# Patient Record
Sex: Male | Born: 1970 | Race: White | Hispanic: No | Marital: Married | Smoking: Never smoker
Health system: Southern US, Community
[De-identification: ages and names within clinical notes are randomized; demographics above are authoritative.]

## PROBLEM LIST (undated history)

## (undated) DIAGNOSIS — E785 Hyperlipidemia, unspecified: Secondary | ICD-10-CM

## (undated) DIAGNOSIS — K219 Gastro-esophageal reflux disease without esophagitis: Secondary | ICD-10-CM

## (undated) DIAGNOSIS — I10 Essential (primary) hypertension: Secondary | ICD-10-CM

## (undated) HISTORY — DX: Gastro-esophageal reflux disease without esophagitis: K21.9

## (undated) HISTORY — DX: Essential (primary) hypertension: I10

## (undated) HISTORY — DX: Hyperlipidemia, unspecified: E78.5

---

## 1997-03-31 HISTORY — PX: TONSILLECTOMY: SUR1361

## 2015-08-22 ENCOUNTER — Other Ambulatory Visit: Payer: Self-pay | Admitting: *Deleted

## 2015-08-22 ENCOUNTER — Encounter: Payer: Self-pay | Admitting: *Deleted

## 2015-08-23 ENCOUNTER — Encounter: Payer: Self-pay | Admitting: Cardiology

## 2015-08-23 ENCOUNTER — Ambulatory Visit (INDEPENDENT_AMBULATORY_CARE_PROVIDER_SITE_OTHER): Payer: 59 | Admitting: Cardiology

## 2015-08-23 VITALS — BP 126/79 | HR 67 | Ht 68.0 in | Wt 238.0 lb

## 2015-08-23 DIAGNOSIS — R0789 Other chest pain: Secondary | ICD-10-CM

## 2015-08-23 NOTE — Patient Instructions (Signed)
Continue all current medications. Follow up in  3 months 

## 2015-08-23 NOTE — Progress Notes (Signed)
Patient ID: Kenneth Rowe, male   DOB: 05/30/1970, 45 y.o.   MRN: [id number]     Clinical Summary [name] is a 45 y.o.male seen today as a new patient  1. Chest pain - 3-4 weeks ago while in [facility] woke from sleep with pain. Left sided pain that was sharp, 5/10.  Radiated down into both arms, hands felt numb and tingling. No other associated symptoms. Not positional. Pain lasted about 30 minutes, felt tired the rest of the day. [facility]. F/u with primary, referred for stress test.  - May 2017 stress echo at [facility] negative for ischemia - episode of chest pain on Tuesday and Wednesday of last week. Sharp pain for just a few seconds - has had some DOE with activities he attributes to weight, deconditioning.    CAD risk factors: Father MI age 44, paternal grandfather MI lates 44s, first cousin MI early 44s.HTN, hyperlipidemia.     SH: works Geneticist, molecular, travels extensively.  Past Medical History  Diagnosis Date  . Hypertension   . Hyperlipidemia   . GERD (gastroesophageal reflux disease)      Allergies  Allergen Reactions  . Lisinopril     Cough     Current Outpatient Prescriptions  Medication Sig Dispense Refill  . amLODipine (NORVASC) 10 MG tablet Take 10 mg by mouth daily.    Marland Kitchen aspirin EC 81 MG tablet Take 81 mg by mouth daily.    Marland Kitchen atorvastatin (LIPITOR) 40 MG tablet Take 40 mg by mouth daily.    . Fish Oil-Cholecalciferol (FISH OIL + D3) 1000-1000 MG-UNIT CAPS Take 1 capsule by mouth daily.    Marland Kitchen losartan (COZAAR) 100 MG tablet Take 100 mg by mouth daily.    Marland Kitchen omeprazole (PRILOSEC) 40 MG capsule Take 40 mg by mouth daily as needed.     No current facility-administered medications for this visit.     No past surgical history on file.   Allergies  Allergen Reactions  . Lisinopril     Cough      Family History  Problem Relation Age of Onset  . Hypertension Mother   . Hypertension Father   .  Heart attack Father   . Diabetes Maternal Grandmother   . Heart attack Paternal Grandfather      Social History [name] reports that he has never smoked. He has never used smokeless tobacco. [name] has no alcohol history on file.   Review of Systems CONSTITUTIONAL: No weight loss, fever, chills, weakness or fatigue.  HEENT: Eyes: No visual loss, blurred vision, double vision or yellow sclerae.No hearing loss, sneezing, congestion, runny nose or sore throat.  SKIN: No rash or itching.  CARDIOVASCULAR: per HPI RESPIRATORY: No shortness of breath, cough or sputum.  GASTROINTESTINAL: No anorexia, nausea, vomiting or diarrhea. No abdominal pain or blood.  GENITOURINARY: No burning on urination, no polyuria NEUROLOGICAL: No headache, dizziness, syncope, paralysis, ataxia, numbness or tingling in the extremities. No change in bowel or bladder control.  MUSCULOSKELETAL: No muscle, back pain, joint pain or stiffness.  LYMPHATICS: No enlarged nodes. No history of splenectomy.  PSYCHIATRIC: No history of depression or anxiety.  ENDOCRINOLOGIC: No reports of sweating, cold or heat intolerance. No polyuria or polydipsia.  Marland Kitchen   Physical Examination Filed Vitals:   08/23/15 0854  BP: 126/79  Pulse: 67   Filed Vitals:   08/23/15 0854  Height: 5' 8"  (1.727 m)  Weight: 238 lb (107.956 kg)  Gen: resting comfortably, no acute distress HEENT: no scleral icterus, pupils equal round and reactive, no palptable cervical adenopathy,  CV: RRR, no m/r/g, no jvd Resp: Clear to auscultation bilaterally GI: abdomen is soft, non-tender, non-distended, normal bowel sounds, no hepatosplenomegaly MSK: extremities are warm, no edema.  Skin: warm, no rash Neuro:  no focal deficits Psych: appropriate affect      Assessment and Plan  1. Chest pain - atypical chest pain. Negative stress echo. Do not recommend further cardiac testing at this time. Continue CAD risk factor modification and  continue to monitor symptoms.   F/u 3 months      Kenneth Rowe, M.D.

## 2015-11-21 ENCOUNTER — Encounter: Payer: Self-pay | Admitting: *Deleted

## 2015-11-22 ENCOUNTER — Encounter: Payer: Self-pay | Admitting: Cardiology

## 2015-11-22 ENCOUNTER — Ambulatory Visit: Payer: 59 | Admitting: Cardiology

## 2015-11-22 NOTE — Progress Notes (Deleted)
Clinical Summary Kenneth Rowe is a 45 y.o.male seen today for follow up of the following medical problems.   . Chest pain - 3-4 weeks ago while in [place] woke from sleep with pain. Left sided pain that was sharp, 5/10.  Radiated down into both arms, hands felt numb and tingling. No other associated symptoms. Not positional. Pain lasted about 30 minutes, felt tired the rest of the day. Seen in [facility]. F/u with primary, referred for stress test.  - May 2017 stress echo at [facility] negative for ischemia - episode of chest pain on Tuesday and Wednesday of last week. Sharp pain for just a few seconds - has had some DOE with activities he attributes to weight, deconditioning.    CAD risk factors: Father MI age 73, paternal grandfather MI lates 69s, first cousin MI early 58s.HTN, hyperlipidemia.     SH: works Geneticist, molecular, travels extensively.  Past Medical History:  Diagnosis Date  . GERD (gastroesophageal reflux disease)   . Hyperlipidemia   . Hypertension      Allergies  Allergen Reactions  . Lisinopril     Cough     Current Outpatient Prescriptions  Medication Sig Dispense Refill  . amLODipine (NORVASC) 10 MG tablet Take 10 mg by mouth daily.    Marland Kitchen aspirin EC 81 MG tablet Take 81 mg by mouth daily.    Marland Kitchen atorvastatin (LIPITOR) 40 MG tablet Take 40 mg by mouth daily.    . Fish Oil-Cholecalciferol (FISH OIL + D3) 1000-1000 MG-UNIT CAPS Take 1 capsule by mouth daily.    Marland Kitchen losartan (COZAAR) 100 MG tablet Take 100 mg by mouth daily.    Marland Kitchen omeprazole (PRILOSEC) 40 MG capsule Take 40 mg by mouth daily as needed.     No current facility-administered medications for this visit.      Past Surgical History:  Procedure Laterality Date  . TONSILLECTOMY  1999     Allergies  Allergen Reactions  . Lisinopril     Cough      Family History  Problem Relation Age of Onset  . Hypertension Mother   . Hypertension Father   .  Heart attack Father   . Diabetes Maternal Grandmother   . Heart attack Paternal Grandfather      Social History [facility]e reports that he has never smoked. He has never used smokeless tobacco. Kenneth Rowe has no alcohol history on file.   Review of Systems CONSTITUTIONAL: No weight loss, fever, chills, weakness or fatigue.  HEENT: Eyes: No visual loss, blurred vision, double vision or yellow sclerae.No hearing loss, sneezing, congestion, runny nose or sore throat.  SKIN: No rash or itching.  CARDIOVASCULAR:  RESPIRATORY: No shortness of breath, cough or sputum.  GASTROINTESTINAL: No anorexia, nausea, vomiting or diarrhea. No abdominal pain or blood.  GENITOURINARY: No burning on urination, no polyuria NEUROLOGICAL: No headache, dizziness, syncope, paralysis, ataxia, numbness or tingling in the extremities. No change in bowel or bladder control.  MUSCULOSKELETAL: No muscle, back pain, joint pain or stiffness.  LYMPHATICS: No enlarged nodes. No history of splenectomy.  PSYCHIATRIC: No history of depression or anxiety.  ENDOCRINOLOGIC: No reports of sweating, cold or heat intolerance. No polyuria or polydipsia.  Marland Kitchen   Physical Examination There were no vitals filed for this visit. There were no vitals filed for this visit.  Gen: resting comfortably, no acute distress HEENT: no scleral icterus, pupils equal round and reactive, no palptable cervical adenopathy,  CV  Resp: Clear to auscultation bilaterally GI: abdomen is soft, non-tender, non-distended, normal bowel sounds, no hepatosplenomegaly MSK: extremities are warm, no edema.  Skin: warm, no rash Neuro:  no focal deficits Psych: appropriate affect   Diagnostic Studies     Assessment and Plan   1. Chest pain - atypical chest pain. Negative stress echo. Do not recommend further cardiac testing at this time. Continue CAD risk factor modification and continue to monitor symptoms.   F/u 3 months     Arnoldo Lenis, M.D., F.A.C.C.

## 2016-04-01 DIAGNOSIS — J329 Chronic sinusitis, unspecified: Secondary | ICD-10-CM | POA: Diagnosis not present

## 2016-04-01 DIAGNOSIS — E785 Hyperlipidemia, unspecified: Secondary | ICD-10-CM | POA: Diagnosis not present

## 2016-04-01 DIAGNOSIS — I10 Essential (primary) hypertension: Secondary | ICD-10-CM | POA: Diagnosis not present

## 2016-12-02 DIAGNOSIS — E785 Hyperlipidemia, unspecified: Secondary | ICD-10-CM | POA: Diagnosis not present

## 2016-12-02 DIAGNOSIS — I10 Essential (primary) hypertension: Secondary | ICD-10-CM | POA: Diagnosis not present

## 2017-06-01 DIAGNOSIS — E785 Hyperlipidemia, unspecified: Secondary | ICD-10-CM | POA: Diagnosis not present

## 2017-06-01 DIAGNOSIS — I10 Essential (primary) hypertension: Secondary | ICD-10-CM | POA: Diagnosis not present

## 2017-06-01 DIAGNOSIS — Z2821 Immunization not carried out because of patient refusal: Secondary | ICD-10-CM | POA: Diagnosis not present

## 2017-07-03 DIAGNOSIS — E782 Mixed hyperlipidemia: Secondary | ICD-10-CM | POA: Diagnosis not present

## 2017-07-03 DIAGNOSIS — I10 Essential (primary) hypertension: Secondary | ICD-10-CM | POA: Diagnosis not present

## 2017-10-20 ENCOUNTER — Encounter: Payer: Self-pay | Admitting: Orthopaedic Surgery

## 2017-10-20 ENCOUNTER — Ambulatory Visit: Payer: 59 | Admitting: Orthopaedic Surgery

## 2017-10-20 ENCOUNTER — Ambulatory Visit (INDEPENDENT_AMBULATORY_CARE_PROVIDER_SITE_OTHER): Payer: 59

## 2017-10-20 DIAGNOSIS — M542 Cervicalgia: Secondary | ICD-10-CM | POA: Diagnosis not present

## 2017-10-20 DIAGNOSIS — M25511 Pain in right shoulder: Secondary | ICD-10-CM

## 2017-10-20 DIAGNOSIS — M25521 Pain in right elbow: Secondary | ICD-10-CM

## 2017-10-20 MED ORDER — NAPROXEN 500 MG PO TABS: 500.0000 mg | ORAL_TABLET | Freq: Two times a day (BID) | ORAL | 5 refills | Status: DC

## 2017-10-21 NOTE — Progress Notes (Signed)
Subjective:    Patient ID: [facility], male    DOB: 1971-01-19, 47 y.o.   MRN: [id number]  HPI He has had pain of the right elbow since February of this year.  He was lifting a dressing on 05-20-17 and since then has had pain in the right anterior shoulder and elbow area.  If he keeps his elbow flexed for a while like using a telephone and then extending his elbow he has pain in the insertion of the biceps area.  He has no redness.  He has had tingling of the long finger at times and it bothers him.  He has pain from the shoulder to the long finger at times.  This happens even without elbow motion.  He has noticed the tingling more and more recently.  He was seen at [facility] and was given a prednisone dose pack.  It really helped but a week after he had finished it the pain returned and so did the tingling.  He has also noticed slight right sided neck pain.    He has not been on any NSAID.  He has tried ice, heat and rubs with no help.  He has no swelling.    Review of Systems  Constitutional: Positive for activity change.  Musculoskeletal: Positive for arthralgias and neck pain.   For Review of Systems, all other systems reviewed and are negative.  Past Medical History:  Diagnosis Date  . GERD (gastroesophageal reflux disease)   . Hyperlipidemia   . Hypertension     Past Surgical History:  Procedure Laterality Date  . TONSILLECTOMY  1999    Current Outpatient Medications on File Prior to Visit  Medication Sig Dispense Refill  . amLODipine (NORVASC) 10 MG tablet Take 10 mg by mouth daily.    Marland Kitchen aspirin EC 81 MG tablet TAKE 1 TABLET ONCE DAILY.    Marland Kitchen atorvastatin (LIPITOR) 40 MG tablet Take 40 mg by mouth daily.    . DOCOSAHEXAENOIC ACID PO Take 1 g by mouth.    . Fish Oil-Cholecalciferol (FISH OIL + D3) 1000-1000 MG-UNIT CAPS Take 1 capsule by mouth daily.    Marland Kitchen losartan (COZAAR) 100 MG tablet Take 100 mg by mouth daily.    Marland Kitchen omeprazole (PRILOSEC) 40 MG capsule Take  40 mg by mouth daily as needed.     No current facility-administered medications on file prior to visit.     Social History   Socioeconomic History  . Marital status: Married    Spouse name: Not on file  . Number of children: Not on file  . Years of education: Not on file  . Highest education level: Not on file  Occupational History    Comment: Regional service manager  Social Needs  . Financial resource strain: Not on file  . Food insecurity:    Worry: Not on file    Inability: Not on file  . Transportation needs:    Medical: Not on file    Non-medical: Not on file  Tobacco Use  . Smoking status: Never Smoker  . Smokeless tobacco: Never Used  Substance and Sexual Activity  . Alcohol use: Not on file  . Drug use: Not on file  . Sexual activity: Not on file  Lifestyle  . Physical activity:    Days per week: Not on file    Minutes per session: Not on file  . Stress: Not on file  Relationships  . Social connections:    Talks on phone: Not  on file    Gets together: Not on file    Attends religious service: Not on file    Active member of club or organization: Not on file    Attends meetings of clubs or organizations: Not on file    Relationship status: Not on file  . Intimate partner violence:    Fear of current or ex partner: Not on file    Emotionally abused: Not on file    Physically abused: Not on file    Forced sexual activity: Not on file  Other Topics Concern  . Not on file  Social History Narrative  . Not on file    Family History  Problem Relation Age of Onset  . Hypertension Mother   . Hypertension Father   . Heart attack Father   . Diabetes Maternal Grandmother   . Heart attack Paternal Grandfather     There were no vitals taken for this visit.  5'8", 243 pounds, afebrile, BP 132/94, pulse 70.    Objective:   Physical Exam  Constitutional: He is oriented to person, place, and time. He appears well-developed and well-nourished.  HENT:    Head: Normocephalic and atraumatic.  Eyes: Pupils are equal, round, and reactive to light. Conjunctivae and EOM are normal.  Neck: Normal range of motion. Neck supple.  Cardiovascular: Normal rate, regular rhythm and intact distal pulses.  Pulmonary/Chest: Effort normal.  Abdominal: Soft.  Musculoskeletal:       Right shoulder: He exhibits tenderness.       Arms: Neurological: He is alert and oriented to person, place, and time. He has normal reflexes. He displays normal reflexes. No cranial nerve deficit. He exhibits normal muscle tone. Coordination normal.  Skin: Skin is warm and dry.  Psychiatric: He has a normal mood and affect. His behavior is normal. Judgment and thought content normal.     X-rays were done of the cervical spine, reported separately.  He has loss of lordosis.    I have reviewed the notes from [facility].  Assessment & Plan:   Encounter Diagnoses  Name Primary?  . Neck pain Yes  . Pain in joint of right shoulder   . Right elbow pain    I am concerned about a possible HNP of the cervical spine.  He has not been on any NSAIDs.  I know he has GERD symptoms and have given precautions but I will begin Naprosyn 500 po bid pc.  He may need MRI of the cervical spine.  Loss of lordosis indicates more of a problem from the neck area.  Return in two weeks.  Call if any problem.  Precautions discussed.   Electronically Signed Sanjuana Kava, MD 7/24/20197:40 AM

## 2017-11-03 ENCOUNTER — Encounter: Payer: Self-pay | Admitting: Orthopaedic Surgery

## 2017-11-03 ENCOUNTER — Ambulatory Visit (INDEPENDENT_AMBULATORY_CARE_PROVIDER_SITE_OTHER): Payer: 59 | Admitting: Orthopaedic Surgery

## 2017-11-03 VITALS — BP 124/82 | HR 74 | Temp 98.1°F | Ht 68.0 in | Wt 243.0 lb

## 2017-11-03 DIAGNOSIS — M542 Cervicalgia: Secondary | ICD-10-CM | POA: Diagnosis not present

## 2017-11-03 NOTE — Progress Notes (Signed)
Patient VO:HYWVPXT [facility], male DOB:1970/06/10, 47 y.o. GGY:[id number]  Chief Complaint  Patient presents with  . Elbow Pain  . Neck Pain    HPI  Kenn[facility] Rowe is a 47 y.o. male who has right arm paresthesias.  He is better with the Naprosyn but still has tingling in the fingers from the shoulder.  He has less pain of the mid upper arm.  His tingling is more of the little and ring fingers but sometimes of the thumb.  He has no new trauma, no redness.  I would like to get a MRI of the cervical spine as I am concerned about a HNP.   Body mass index is 36.95 kg/m.  ROS  Review of Systems  Constitutional: Positive for activity change.  Musculoskeletal: Positive for arthralgias and neck pain.  All other systems reviewed and are negative.   All other systems reviewed and are negative.  Past Medical History:  Diagnosis Date  . GERD (gastroesophageal reflux disease)   . Hyperlipidemia   . Hypertension     Past Surgical History:  Procedure Laterality Date  . TONSILLECTOMY  1999    Family History  Problem Relation Age of Onset  . Hypertension Mother   . Hypertension Father   . Heart attack Father   . Diabetes Maternal Grandmother   . Heart attack Paternal Grandfather     Social History Social History   Tobacco Use  . Smoking status: Never Smoker  . Smokeless tobacco: Never Used  Substance Use Topics  . Alcohol use: Not on file  . Drug use: Not on file    Allergies  Allergen Reactions  . Lisinopril     Cough    Current Outpatient Medications  Medication Sig Dispense Refill  . amLODipine (NORVASC) 10 MG tablet Take 10 mg by mouth daily.    Marland Kitchen aspirin EC 81 MG tablet TAKE 1 TABLET ONCE DAILY.    Marland Kitchen atorvastatin (LIPITOR) 40 MG tablet Take 40 mg by mouth daily.    . DOCOSAHEXAENOIC ACID PO Take 1 g by mouth.    . Fish Oil-Cholecalciferol (FISH OIL + D3) 1000-1000 MG-UNIT CAPS Take 1 capsule by mouth daily.    Marland Kitchen losartan (COZAAR) 100 MG tablet Take 100  mg by mouth daily.    . naproxen (NAPROSYN) 500 MG tablet Take 1 tablet (500 mg total) by mouth 2 (two) times daily with a meal. 60 tablet 5  . omeprazole (PRILOSEC) 40 MG capsule Take 40 mg by mouth daily as needed.     No current facility-administered medications for this visit.      Physical Exam  Blood pressure 124/82, pulse 74, temperature 98.1 F (36.7 C), height 5' 8"  (1.727 m), weight 243 lb (110.2 kg).  Constitutional: overall normal hygiene, normal nutrition, well developed, normal grooming, normal body habitus. Assistive device:none  Musculoskeletal: gait and station Limp none, muscle tone and strength are normal, no tremors or atrophy is present.  .  Neurological: coordination overall normal.  Deep tendon reflex/nerve stretch intact.  Sensation normal.  Cranial nerves II-XII intact.   Skin:   Normal overall no scars, lesions, ulcers or rashes. No psoriasis.  Psychiatric: Alert and oriented x 3.  Recent memory intact, remote memory unclear.  Normal mood and affect. Well groomed.  Good eye contact.  Cardiovascular: overall no swelling, no varicosities, no edema bilaterally, normal temperatures of the legs and arms, no clubbing, cyanosis and good capillary refill.  Lymphatic: palpation is normal.  Right shoulder has  full motion.  Neck has full motion.  Grips are good.  NV intact.  All other systems reviewed and are negative   The patient has been educated about the nature of the problem(s) and counseled on treatment options.  The patient appeared to understand what I have discussed and is in agreement with it.  Encounter Diagnosis  Name Primary?  . Neck pain Yes    PLAN Call if any problems.  Precautions discussed.  Continue current medications.   Return to clinic after MRI of the cervical spine.   Electronically Signed Sanjuana Kava, MD 8/6/20192:48 PM

## 2017-11-11 ENCOUNTER — Ambulatory Visit (HOSPITAL_COMMUNITY)
Admission: RE | Admit: 2017-11-11 | Discharge: 2017-11-11 | Disposition: A | Discharge status: Home / Self Care | Payer: 59 | Source: Ambulatory Visit | Attending: Orthopaedic Surgery | Admitting: Orthopaedic Surgery

## 2017-11-11 DIAGNOSIS — R202 Paresthesia of skin: Secondary | ICD-10-CM | POA: Diagnosis not present

## 2017-11-11 DIAGNOSIS — M542 Cervicalgia: Secondary | ICD-10-CM | POA: Insufficient documentation

## 2017-11-12 ENCOUNTER — Encounter: Payer: Self-pay | Admitting: Orthopaedic Surgery

## 2017-11-12 ENCOUNTER — Ambulatory Visit (INDEPENDENT_AMBULATORY_CARE_PROVIDER_SITE_OTHER): Payer: 59 | Admitting: Orthopaedic Surgery

## 2017-11-12 VITALS — BP 121/81 | HR 69 | Temp 98.3°F | Ht 69.0 in | Wt 238.0 lb

## 2017-11-12 DIAGNOSIS — M25521 Pain in right elbow: Secondary | ICD-10-CM | POA: Diagnosis not present

## 2017-11-12 DIAGNOSIS — M25511 Pain in right shoulder: Secondary | ICD-10-CM

## 2017-11-12 NOTE — Progress Notes (Signed)
Patient [facility], male DOB:Nov 15, 1970, 47 y.o. BOF:[id number]  Chief Complaint  Patient presents with  . Torticollis    Recheck on neck pain, review MRI results.    HPI  [facility]e is a 47 y.o. male who has right arm pain, paresthesias to the little finger on the right and neck pain.  The neck pain has resolved.  He had a MRI of the cervical spine showing: IMPRESSION: Normal MRI appearance of the cervical spine - with no disc herniation, spinal stenosis, or neural impingement - aside from nonspecific loss of cervical lordosis  I have explained the findings to him.  I had thought he had a HNP of the cervical spine as he had loss of the cervical lordosis, the paresthesias to the little finger on the right, and neck pain.    I will have a hand surgeon see him in regards to the upper arm pain he has.  It is positional.  It still bothers him.    His paresthesias to the little finger is still present but less.    Body mass index is 35.15 kg/m.  ROS  Review of Systems  Constitutional: Positive for activity change.  Musculoskeletal: Positive for arthralgias and neck pain.  All other systems reviewed and are negative.   All other systems reviewed and are negative.  Past Medical History:  Diagnosis Date  . GERD (gastroesophageal reflux disease)   . Hyperlipidemia   . Hypertension     Past Surgical History:  Procedure Laterality Date  . TONSILLECTOMY  1999    Family History  Problem Relation Age of Onset  . Hypertension Mother   . Hypertension Father   . Heart attack Father   . Diabetes Maternal Grandmother   . Heart attack Paternal Grandfather     Social History Social History   Tobacco Use  . Smoking status: Never Smoker  . Smokeless tobacco: Never Used  Substance Use Topics  . Alcohol use: Not on file  . Drug use: Not on file    Allergies  Allergen Reactions  . Lisinopril     Cough    Current Outpatient Medications  Medication  Sig Dispense Refill  . amLODipine (NORVASC) 10 MG tablet Take 10 mg by mouth daily.    Marland Kitchen aspirin EC 81 MG tablet TAKE 1 TABLET ONCE DAILY.    Marland Kitchen atorvastatin (LIPITOR) 40 MG tablet Take 40 mg by mouth daily.    . DOCOSAHEXAENOIC ACID PO Take 1 g by mouth.    . Fish Oil-Cholecalciferol (FISH OIL + D3) 1000-1000 MG-UNIT CAPS Take 1 capsule by mouth daily.    Marland Kitchen losartan (COZAAR) 100 MG tablet Take 100 mg by mouth daily.    . naproxen (NAPROSYN) 500 MG tablet Take 1 tablet (500 mg total) by mouth 2 (two) times daily with a meal. 60 tablet 5  . omeprazole (PRILOSEC) 40 MG capsule Take 40 mg by mouth daily as needed.     No current facility-administered medications for this visit.      Physical Exam  Blood pressure 121/81, pulse 69, temperature 98.3 F (36.8 C), height 5' 9"  (1.753 m), weight 238 lb (108 kg).  Constitutional: overall normal hygiene, normal nutrition, well developed, normal grooming, normal body habitus. Assistive device:none  Musculoskeletal: gait and station Limp none, muscle tone and strength are normal, no tremors or atrophy is present.  .  Neurological: coordination overall normal.  Deep tendon reflex/nerve stretch intact.  Sensation normal.  Cranial nerves II-XII intact.  Skin:   Normal overall no scars, lesions, ulcers or rashes. No psoriasis.  Psychiatric: Alert and oriented x 3.  Recent memory intact, remote memory unclear.  Normal mood and affect. Well groomed.  Good eye contact.  Cardiovascular: overall no swelling, no varicosities, no edema bilaterally, normal temperatures of the legs and arms, no clubbing, cyanosis and good capillary refill.  Lymphatic: palpation is normal.  ROM of the right elbow is full.  He has no defect of the biceps area or any area in the elbow or upper arm area where he has pain.  He has no swelling.  NV intact.  Strength is normal.    All other systems reviewed and are negative   The patient has been educated about the nature of  the problem(s) and counseled on treatment options.  The patient appeared to understand what I have discussed and is in agreement with it.  No diagnosis found.  PLAN Call if any problems.  Precautions discussed.  Continue current medications.   Return to clinic to see hand surgeon   Electronically Signed Sanjuana Kava, MD 8/15/20192:21 PM

## 2017-11-12 NOTE — Patient Instructions (Signed)
[facility] [address] [place],  [place]  [address]  Get Driving Directions  Main: [phone]  Fax: [phone]

## 2017-12-01 ENCOUNTER — Ambulatory Visit (INDEPENDENT_AMBULATORY_CARE_PROVIDER_SITE_OTHER): Payer: 59 | Admitting: Orthopedic Surgery

## 2017-12-22 ENCOUNTER — Encounter (INDEPENDENT_AMBULATORY_CARE_PROVIDER_SITE_OTHER): Payer: Self-pay | Admitting: Orthopedic Surgery

## 2017-12-22 ENCOUNTER — Ambulatory Visit (INDEPENDENT_AMBULATORY_CARE_PROVIDER_SITE_OTHER): Payer: Self-pay

## 2017-12-22 ENCOUNTER — Ambulatory Visit (INDEPENDENT_AMBULATORY_CARE_PROVIDER_SITE_OTHER): Payer: 59 | Admitting: Physician Assistant

## 2017-12-22 VITALS — Ht 69.0 in | Wt 238.0 lb

## 2017-12-22 DIAGNOSIS — M7541 Impingement syndrome of right shoulder: Secondary | ICD-10-CM

## 2017-12-22 DIAGNOSIS — M79601 Pain in right arm: Secondary | ICD-10-CM

## 2017-12-23 ENCOUNTER — Encounter (INDEPENDENT_AMBULATORY_CARE_PROVIDER_SITE_OTHER): Payer: Self-pay | Admitting: Physician Assistant

## 2017-12-23 DIAGNOSIS — M79601 Pain in right arm: Secondary | ICD-10-CM

## 2017-12-23 DIAGNOSIS — M7541 Impingement syndrome of right shoulder: Secondary | ICD-10-CM | POA: Diagnosis not present

## 2017-12-23 MED ORDER — METHYLPREDNISOLONE ACETATE 40 MG/ML IJ SUSP
40.0000 mg | INTRAMUSCULAR | Status: AC | PRN
  Administered 2017-12-23: 40 mg via INTRA_ARTICULAR

## 2017-12-23 MED ORDER — LIDOCAINE HCL 1 % IJ SOLN
5.0000 mL | INTRAMUSCULAR | Status: AC | PRN
  Administered 2017-12-23: 5 mL

## 2017-12-23 NOTE — Progress Notes (Signed)
Office Visit Note   Patient: Kenneth Rowe           Date of Birth: March 01, 1971           MRN: [id number] Visit Date: 12/22/2017              Requested by: Caryl Bis, MD [place], [place] [address] PCP: Caryl Bis, MD  Chief Complaint  Patient presents with  . Right Upper Arm - Pain      HPI: Patient is a 47 year old male who presents with 7 months history of right arm pain.  He reports that in February of this year he was lifting a dresser to move it and he developed pain in the right biceps and triceps area following this.  He states that it hurt off and on ever since then.  He has been taking some Naprosyn 500 mg with some relief but not consistent.  He says he feels like he tore something in his arm.  He did have an MRI of his cervical spine done about 4 weeks ago through his primary care physician and this was negative for acute injuries fractures and otherwise looked normal.  Radiographs today of the right arm are normal.  He reports some night pain.  He is tried heat and ice with little relief.  He reports that it is a nagging throbbing discomfort.  He reports pain when he tries to hold his arm up for instance like when he rolled his cell phone in his right hand up to his ear.  Assessment & Plan: Visit Diagnoses:  1. Impingement syndrome of right shoulder   2. Right upper limb pain     Plan: Right shoulder was injected with Depo-medrol /lidocaine under sterile techniques and the patient tolerated this well. He will follow up in 4 weeks or sooner should he have difficulties in the interim.   Follow-Up Instructions: Return in about 4 weeks (around 01/19/2018).   Ortho Exam  Patient is alert, oriented, no adenopathy, well-dressed, normal affect, normal respiratory effort. Examination of the right upper extremity shows the patient to be tender to palpation over the proximal biceps insertion and deltoid.  He has pain with abduction internal rotation of the  shoulder.  He is nontender to palpation over the [facility] joint.  Imaging: Xr Humerus Right  Result Date: 12/23/2017 Negative for fracture or other abnormality  No images are attached to the encounter.  Labs: No results found for: HGBA1C, ESRSEDRATE, CRP, LABURIC, REPTSTATUS, GRAMSTAIN, CULT, LABORGA   No results found for: ALBUMIN, PREALBUMIN, LABURIC  Body mass index is 35.15 kg/m.  Orders:  Orders Placed This Encounter  Procedures  . XR Humerus Right   No orders of the defined types were placed in this encounter.    Procedures: Large Joint Inj: R subacromial bursa on 12/23/2017 10:13 AM Indications: diagnostic evaluation and pain Details: 22 G 1.5 in needle, posterior approach  Arthrogram: No  Medications: 5 mL lidocaine 1 %; 40 mg methylPREDNISolone acetate 40 MG/ML Outcome: tolerated well, no immediate complications Procedure, treatment alternatives, risks and benefits explained, specific risks discussed. Consent was given by the patient. Immediately prior to procedure a time out was called to verify the correct patient, procedure, equipment, support staff and site/side marked as required. Patient was prepped and draped in the usual sterile fashion.      Clinical Data: No additional findings.  ROS:  All other systems negative, except as noted in the HPI. Review  of Systems  Objective: Vital Signs: Ht 5' 9"  (1.753 m)   Wt 238 lb (108 kg)   BMI 35.15 kg/m   Specialty Comments:  No specialty comments available.  PMFS History: There are no active problems to display for this patient.  Past Medical History:  Diagnosis Date  . GERD (gastroesophageal reflux disease)   . Hyperlipidemia   . Hypertension     Family History  Problem Relation Age of Onset  . Hypertension Mother   . Hypertension Father   . Heart attack Father   . Diabetes Maternal Grandmother   . Heart attack Paternal Grandfather     Past Surgical History:  Procedure Laterality Date  .  TONSILLECTOMY  1999   Social History   Occupational History    CommentGarment/textile technologist  Tobacco Use  . Smoking status: Never Smoker  . Smokeless tobacco: Never Used  Substance and Sexual Activity  . Alcohol use: Not on file  . Drug use: Not on file  . Sexual activity: Not on file

## 2017-12-31 DIAGNOSIS — E782 Mixed hyperlipidemia: Secondary | ICD-10-CM | POA: Diagnosis not present

## 2017-12-31 DIAGNOSIS — I10 Essential (primary) hypertension: Secondary | ICD-10-CM | POA: Diagnosis not present

## 2018-01-05 DIAGNOSIS — E782 Mixed hyperlipidemia: Secondary | ICD-10-CM | POA: Diagnosis not present

## 2018-01-05 DIAGNOSIS — R0789 Other chest pain: Secondary | ICD-10-CM | POA: Diagnosis not present

## 2018-01-05 DIAGNOSIS — I10 Essential (primary) hypertension: Secondary | ICD-10-CM | POA: Diagnosis not present

## 2018-01-11 DIAGNOSIS — R0789 Other chest pain: Secondary | ICD-10-CM | POA: Diagnosis not present

## 2018-01-11 DIAGNOSIS — E785 Hyperlipidemia, unspecified: Secondary | ICD-10-CM | POA: Diagnosis not present

## 2018-01-11 DIAGNOSIS — I10 Essential (primary) hypertension: Secondary | ICD-10-CM | POA: Diagnosis not present

## 2018-01-19 ENCOUNTER — Ambulatory Visit (INDEPENDENT_AMBULATORY_CARE_PROVIDER_SITE_OTHER): Payer: 59 | Admitting: Orthopedic Surgery

## 2018-01-21 ENCOUNTER — Encounter: Payer: Self-pay | Admitting: *Deleted

## 2018-01-21 ENCOUNTER — Telehealth: Payer: Self-pay

## 2018-01-21 ENCOUNTER — Telehealth: Payer: Self-pay | Admitting: Cardiology

## 2018-01-21 NOTE — Telephone Encounter (Signed)
SENT REFERRAL TO SCHEDULING AND FILED NOTES

## 2018-01-21 NOTE — Telephone Encounter (Signed)
Pt scheduled with L Kilroy 11/29 - will request stress test

## 2018-01-21 NOTE — Telephone Encounter (Signed)
Patient worried about Stress test results that Dr Pleas Koch spoke to him about.   Said that he had test peformed at [facility].  He is currently scheduled in [facility] Nov 29 with an extender but wants to be seen asap by Dr Harl Bowie if possible

## 2018-01-21 NOTE — Telephone Encounter (Signed)
Stress test received

## 2018-01-22 NOTE — Telephone Encounter (Signed)
Pt declined stress test says his wife called and set him up with provider in the [place] for Tuesday - pt and wife did not want to wait for an appt

## 2018-01-22 NOTE — Telephone Encounter (Signed)
Stress test reviewed, from report suggests abnormal EKG findings that may suggest a blockage. Needs a more detailed stress test to further evaluate, can we set him up for an exercise nuclear stress test next week for chest pain. Hold norvasc day of test.   Zandra Abts MD

## 2018-01-26 ENCOUNTER — Ambulatory Visit (INDEPENDENT_AMBULATORY_CARE_PROVIDER_SITE_OTHER): Payer: 59 | Admitting: Physician Assistant

## 2018-01-26 ENCOUNTER — Encounter: Payer: Self-pay | Admitting: Physician Assistant

## 2018-01-26 VITALS — BP 140/101 | HR 67 | Ht 69.0 in | Wt 243.4 lb

## 2018-01-26 DIAGNOSIS — R079 Chest pain, unspecified: Secondary | ICD-10-CM | POA: Diagnosis not present

## 2018-01-26 DIAGNOSIS — Z01812 Encounter for preprocedural laboratory examination: Secondary | ICD-10-CM | POA: Diagnosis not present

## 2018-01-26 DIAGNOSIS — R9439 Abnormal result of other cardiovascular function study: Secondary | ICD-10-CM

## 2018-01-26 DIAGNOSIS — I10 Essential (primary) hypertension: Secondary | ICD-10-CM

## 2018-01-26 DIAGNOSIS — E785 Hyperlipidemia, unspecified: Secondary | ICD-10-CM

## 2018-01-26 LAB — CBC WITH DIFFERENTIAL/PLATELET
Basophils Absolute: 0 10*3/uL (ref 0.0–0.2)
Basos: 0 %
EOS (ABSOLUTE): 0.2 10*3/uL (ref 0.0–0.4)
EOS: 2 %
HEMATOCRIT: 44.5 % (ref 37.5–51.0)
Hemoglobin: 15.2 g/dL (ref 13.0–17.7)
IMMATURE GRANS (ABS): 0 10*3/uL (ref 0.0–0.1)
Immature Granulocytes: 0 %
LYMPHS ABS: 3 10*3/uL (ref 0.7–3.1)
LYMPHS: 42 %
MCH: 29 pg (ref 26.6–33.0)
MCHC: 34.2 g/dL (ref 31.5–35.7)
MCV: 85 fL (ref 79–97)
Monocytes Absolute: 0.6 10*3/uL (ref 0.1–0.9)
Monocytes: 8 %
NEUTROS ABS: 3.5 10*3/uL (ref 1.4–7.0)
Neutrophils: 48 %
Platelets: 243 10*3/uL (ref 150–450)
RBC: 5.25 x10E6/uL (ref 4.14–5.80)
RDW: 12.4 % (ref 12.3–15.4)
WBC: 7.2 10*3/uL (ref 3.4–10.8)

## 2018-01-26 LAB — BASIC METABOLIC PANEL
BUN/Creatinine Ratio: 15 (ref 9–20)
BUN: 15 mg/dL (ref 6–24)
CO2: 22 mmol/L (ref 20–29)
CREATININE: 0.98 mg/dL (ref 0.76–1.27)
Calcium: 9.6 mg/dL (ref 8.7–10.2)
Chloride: 98 mmol/L (ref 96–106)
GFR calc Af Amer: 106 mL/min/{1.73_m2} (ref >59)
GFR, EST NON AFRICAN AMERICAN: 92 mL/min/{1.73_m2} (ref >59)
Glucose: 92 mg/dL (ref 65–99)
Potassium: 3.9 mmol/L (ref 3.5–5.2)
SODIUM: 137 mmol/L (ref 134–144)

## 2018-01-26 NOTE — Patient Instructions (Signed)
Medication Instructions:  Your physician recommends that you continue on your current medications as directed. Please refer to the Current Medication list given to you today.  If you need a refill on your cardiac medications before your next appointment, please call your pharmacy.   Lab work: BMET/CBC TODAY  If you have labs (blood work) drawn today and your tests are completely normal, you will receive your results only by: Marland Kitchen MyChart Message (if you have MyChart) OR . A paper copy in the mail If you have any lab test that is abnormal or we need to change your treatment, we will call you to review the results.  Testing/Procedures: Your physician has requested that you have a cardiac catheterization. Cardiac catheterization is used to diagnose and/or treat various heart conditions. Doctors may recommend this procedure for a number of different reasons. The most common reason is to evaluate chest pain. Chest pain can be a symptom of coronary artery disease (CAD), and cardiac catheterization can show whether plaque is narrowing or blocking your heart's arteries. This procedure is also used to evaluate the valves, as well as measure the blood flow and oxygen levels in different parts of your heart. For further information please visit [email or web address][facility]tn. Please follow instruction sheet, as given.  Follow-Up: DR [facility] IN ABOUT 3-4 WEEKS  Any Other Special Instructions Will Be Listed Below (If Applicable).     [place] [address] Dept: [phone] Loc: [place]  01/26/2018  You are scheduled for a Cardiac Catheterization on Thursday, October 31 with Dr. Daneen Schick.  1. Please arrive at the [facility] (Main Entrance A) at [facility]: [address] [place], [place] [address] at 7:00 AM (This time is two hours before your procedure to  ensure your preparation). Free valet parking service is available.   Special note: Every effort is made to have your procedure done on time. Please understand that emergencies sometimes delay scheduled procedures.  2. Diet: Do not eat solid foods after midnight.  The patient may have clear liquids until 5am upon the day of the procedure.  3. Labs: You will need to have blood drawn on TODAY  4. Medication instructions in preparation for your procedure:   Contrast Allergy: No  HOLD NAPROXEN UNTIL AFTER CATH    On the morning of your procedure, take your Aspirin and any morning medicines NOT listed above.  You may use sips of water.  5. Plan for one night stay--bring personal belongings. 6. Bring a current list of your medications and current insurance cards. 7. You MUST have a responsible person to drive you home. 8. Someone MUST be with you the first 24 hours after you arrive home or your discharge will be delayed. 9. Please wear clothes that are easy to get on and off and wear slip-on shoes.  Thank you for allowing [place] to care for you!   -- [facility]

## 2018-01-26 NOTE — H&P (View-Only) (Signed)
Cardiology Office Note   Date:  01/26/2018   ID:  Kenneth Rowe, DOB 08-08-1970, MRN [id number]  PCP:  Caryl Bis, MD Cardiologist:  Carlyle Dolly, MD 08/23/2015 Rosaria Ferries, PA-C   Chief Complaint  Patient presents with  . Follow-up    History of Present Illness: ALY HAUSER is a 47 y.o. male with a history of left-sided chest pain with negative stress echo 07/2015 at [facility], FH CAD, HTN, HLD, GERD  Phone notes starting 10/24 regarding stress test performed at [facility], appointment made 10/25 Dr. Harl Bowie reviewed the ECG findings and recommended an exercise nuclear stress test  Ruffin Pyo presents for cardiology evaluation.   He had an episode of chest pain. It started hurting while watching a movie. Later it got worse, 5-6/10. Since then, it would come and go. No obvious change w/ deep inspiration or position change. No rx tried. It lasted a few hours off and on. Like a tightness. On the L chest. Initial episode 10/05, since then has had tightness that started 10/06-07 and has been at a 1-2/10 continuously. He is having it now.   Thyroid and other labs by PCP were ok.   BP very high during stress test but he had been told not to take BP meds that day.   His job is very stressful and he feels that some of the reasons he feels so bad may be coming from the stress.  Exertion does not make his chest pain worse.  He has not tried any medications, but is not aware of anything that makes it better.  He and his wife had started eating better and were starting to walk in the evenings.  Prior to this, he was not exercising.  He thinks he would feel better if he could lose some weight, his weight in his 20s when he was exercising was about 100 pounds less than it is now.  He is very concerned about the possibility of coronary artery disease because his father died of a heart attack at 27 and had his first heart attack at 59.   Past Medical  History:  Diagnosis Date  . GERD (gastroesophageal reflux disease)   . Hyperlipidemia   . Hypertension     Past Surgical History:  Procedure Laterality Date  . TONSILLECTOMY  1999    Current Outpatient Medications  Medication Sig Dispense Refill  . amLODipine (NORVASC) 10 MG tablet Take 10 mg by mouth daily.    Marland Kitchen aspirin EC 81 MG tablet TAKE 1 TABLET ONCE DAILY.    Marland Kitchen atorvastatin (LIPITOR) 40 MG tablet Take 40 mg by mouth daily.    . DOCOSAHEXAENOIC ACID PO Take 1 g by mouth.    . Fish Oil-Cholecalciferol (FISH OIL + D3) 1000-1000 MG-UNIT CAPS Take 1 capsule by mouth daily.    Marland Kitchen losartan (COZAAR) 100 MG tablet Take 100 mg by mouth daily.    . naproxen (NAPROSYN) 500 MG tablet Take 1 tablet (500 mg total) by mouth 2 (two) times daily with a meal. 60 tablet 5  . omeprazole (PRILOSEC) 40 MG capsule Take 40 mg by mouth daily as needed.     No current facility-administered medications for this visit.     Allergies:   Lisinopril    Social History:  The patient  reports that he has never smoked. He has never used smokeless tobacco.   Family History:  The patient's family history includes Diabetes in his maternal grandmother; Heart  attack in his paternal grandfather; Heart attack (age of onset: 22) in his father; Hypertension in his father and mother.  He indicated that the status of his mother is unknown. He indicated that the status of his father is unknown. He indicated that the status of his maternal grandmother is unknown. He indicated that the status of his paternal grandfather is unknown.    ROS:  Please see the history of present illness. All other systems are reviewed and negative.    PHYSICAL EXAM: VS:  BP (!) 140/101   Pulse 67   Ht 5' 9"  (1.753 m)   Wt 243 lb 6.4 oz (110.4 kg)   BMI 35.94 kg/m  , BMI Body mass index is 35.94 kg/m. GEN: Well nourished, well developed, male in no acute distress HEENT: normal for age  Neck: no JVD, no carotid bruit, no  masses Cardiac: RRR; no murmur, no rubs, or gallops Respiratory:  clear to auscultation bilaterally, normal work of breathing GI: soft, nontender, nondistended, + BS MS: no deformity or atrophy; no edema; distal pulses are 2+ in all 4 extremities  Skin: warm and dry, no rash Neuro:  Strength and sensation are intact Psych: euthymic mood, full affect   EKG:  EKG is ordered today. The ekg ordered today demonstrates sinus rhythm, no acute ischemic changes, no pathologic Q waves, inverted T waves in lead III were seen on previous ECG.  Does not currently meet criteria for LVH  Stress test: Performed at [facility] He reached greater than 85% of his target heart rate.  However, he had ST depression in inferior and lateral leads that was flat and possibly downsloping at peak exertion.  This resolved in recovery.  Recent Labs: No results found for requested labs within last 8760 hours.  CBC No results found for: WBC, RBC, HGB, HCT, PLT, MCV, MCH, MCHC, RDW, LYMPHSABS, MONOABS, EOSABS, BASOSABS No flowsheet data found.   Lipid Panel No results found for: CHOL, TRIG, HDL, CHOLHDL, VLDL, LDLCALC, LDLDIRECT   Wt Readings from Last 3 Encounters:  01/26/18 243 lb 6.4 oz (110.4 kg)  12/22/17 238 lb (108 kg)  11/12/17 238 lb (108 kg)     Other studies Reviewed: Additional studies/ records that were reviewed today include: Office notes and testing.  ASSESSMENT AND PLAN: The patient was reviewed with Dr. Harrell Gave, who agrees  1.  Chest pain, moderate risk of cardiac etiology: His risk factors are poorly controlled.  He has not been eating a heart healthy diet.  He has a strong family history of premature coronary artery disease in his father. - I discussed the different testing modalities.  He would likely need a 2 day Lexiscan.  Although a cardiac CT is less invasive than a cardiac catheterization, they both use dye.  I feel his risk is high enough that I would not want to risk to  procedures with dye. -The patient and his wife want a definitive answer as soon as possible. - The risks and benefits of a cardiac catheterization including, but not limited to, death, stroke, MI, kidney damage and bleeding were discussed with the patient and his wife who indicate understanding and agree to proceed.  -We will schedule this at the first available opportunity.  2.  Hyperlipidemia: This is followed by his PCP.  He is trying to eat a healthier diet.  If he is positive for CAD, his goal LDL drops to less than 70.  3.  Hypertension: He was extremely hypertensive during the stress  test, but had been told to hold his blood pressure medications that day. -He feels his blood pressure does not normally run as high as it is today, he thinks it is because he is anxious about the possibility of coronary artery disease. - Once the catheterization is completed, reassess goals for blood pressure management, he may need some medication changes   Current medicines are reviewed at length with the patient today.  The patient does not have concerns regarding medicines.  The following changes have been made:  no change  Labs/ tests ordered today include:   Orders Placed This Encounter  Procedures  . CBC with Differential/Platelet  . Basic metabolic panel  . EKG 12-Lead     Disposition:   FU with Carlyle Dolly, MD  Signed, Rosaria Ferries, PA-C  01/26/2018 1:29 PM    [facility] Phone: [phone]; Fax: [phone]  This note was written with the assistance of speech recognition software.  Please excuse any transcriptional errors.

## 2018-01-26 NOTE — Progress Notes (Signed)
Cardiology Office Note   Date:  01/26/2018   ID:  Kenneth Rowe, DOB 04/27/1970, MRN [id number]  PCP:  Caryl Bis, MD Cardiologist:  Carlyle Dolly, MD 08/23/2015 Rosaria Ferries, PA-C   Chief Complaint  Patient presents with  . Follow-up    History of Present Illness: Kenneth Rowe is a 47 y.o. male with a history of left-sided chest pain with negative stress echo 07/2015 at [facility], FH CAD, HTN, HLD, GERD  Phone notes starting 10/24 regarding stress test performed at [facility], appointment made 10/25 Dr. Harl Bowie reviewed the ECG findings and recommended an exercise nuclear stress test  Kenneth Rowe presents for cardiology evaluation.   He had an episode of chest pain. It started hurting while watching a movie. Later it got worse, 5-6/10. Since then, it would come and go. No obvious change w/ deep inspiration or position change. No rx tried. It lasted a few hours off and on. Like a tightness. On the L chest. Initial episode 10/05, since then has had tightness that started 10/06-07 and has been at a 1-2/10 continuously. He is having it now.   Thyroid and other labs by PCP were ok.   BP very high during stress test but he had been told not to take BP meds that day.   His job is very stressful and he feels that some of the reasons he feels so bad may be coming from the stress.  Exertion does not make his chest pain worse.  He has not tried any medications, but is not aware of anything that makes it better.  He and his wife had started eating better and were starting to walk in the evenings.  Prior to this, he was not exercising.  He thinks he would feel better if he could lose some weight, his weight in his 20s when he was exercising was about 100 pounds less than it is now.  He is very concerned about the possibility of coronary artery disease because his father died of a heart attack at 23 and had his first heart attack at 44.   Past Medical  History:  Diagnosis Date  . GERD (gastroesophageal reflux disease)   . Hyperlipidemia   . Hypertension     Past Surgical History:  Procedure Laterality Date  . TONSILLECTOMY  1999    Current Outpatient Medications  Medication Sig Dispense Refill  . amLODipine (NORVASC) 10 MG tablet Take 10 mg by mouth daily.    Marland Kitchen aspirin EC 81 MG tablet TAKE 1 TABLET ONCE DAILY.    Marland Kitchen atorvastatin (LIPITOR) 40 MG tablet Take 40 mg by mouth daily.    . DOCOSAHEXAENOIC ACID PO Take 1 g by mouth.    . Fish Oil-Cholecalciferol (FISH OIL + D3) 1000-1000 MG-UNIT CAPS Take 1 capsule by mouth daily.    Marland Kitchen losartan (COZAAR) 100 MG tablet Take 100 mg by mouth daily.    . naproxen (NAPROSYN) 500 MG tablet Take 1 tablet (500 mg total) by mouth 2 (two) times daily with a meal. 60 tablet 5  . omeprazole (PRILOSEC) 40 MG capsule Take 40 mg by mouth daily as needed.     No current facility-administered medications for this visit.     Allergies:   Lisinopril    Social History:  The patient  reports that he has never smoked. He has never used smokeless tobacco.   Family History:  The patient's family history includes Diabetes in his maternal grandmother; Heart  attack in his paternal grandfather; Heart attack (age of onset: 1) in his father; Hypertension in his father and mother.  He indicated that the status of his mother is unknown. He indicated that the status of his father is unknown. He indicated that the status of his maternal grandmother is unknown. He indicated that the status of his paternal grandfather is unknown.    ROS:  Please see the history of present illness. All other systems are reviewed and negative.    PHYSICAL EXAM: VS:  BP (!) 140/101   Pulse 67   Ht 5' 9"  (1.753 m)   Wt 243 lb 6.4 oz (110.4 kg)   BMI 35.94 kg/m  , BMI Body mass index is 35.94 kg/m. GEN: Well nourished, well developed, male in no acute distress HEENT: normal for age  Neck: no JVD, no carotid bruit, no  masses Cardiac: RRR; no murmur, no rubs, or gallops Respiratory:  clear to auscultation bilaterally, normal work of breathing GI: soft, nontender, nondistended, + BS MS: no deformity or atrophy; no edema; distal pulses are 2+ in all 4 extremities  Skin: warm and dry, no rash Neuro:  Strength and sensation are intact Psych: euthymic mood, full affect   EKG:  EKG is ordered today. The ekg ordered today demonstrates sinus rhythm, no acute ischemic changes, no pathologic Q waves, inverted T waves in lead III were seen on previous ECG.  Does not currently meet criteria for LVH  Stress test: Performed at [facility] He reached greater than 85% of his target heart rate.  However, he had ST depression in inferior and lateral leads that was flat and possibly downsloping at peak exertion.  This resolved in recovery.  Recent Labs: No results found for requested labs within last 8760 hours.  CBC No results found for: WBC, RBC, HGB, HCT, PLT, MCV, MCH, MCHC, RDW, LYMPHSABS, MONOABS, EOSABS, BASOSABS No flowsheet data found.   Lipid Panel No results found for: CHOL, TRIG, HDL, CHOLHDL, VLDL, LDLCALC, LDLDIRECT   Wt Readings from Last 3 Encounters:  01/26/18 243 lb 6.4 oz (110.4 kg)  12/22/17 238 lb (108 kg)  11/12/17 238 lb (108 kg)     Other studies Reviewed: Additional studies/ records that were reviewed today include: Office notes and testing.  ASSESSMENT AND PLAN: The patient was reviewed with Dr. Harrell Gave, who agrees  1.  Chest pain, moderate risk of cardiac etiology: His risk factors are poorly controlled.  He has not been eating a heart healthy diet.  He has a strong family history of premature coronary artery disease in his father. - I discussed the different testing modalities.  He would likely need a 2 day Lexiscan.  Although a cardiac CT is less invasive than a cardiac catheterization, they both use dye.  I feel his risk is high enough that I would not want to risk to  procedures with dye. -The patient and his wife want a definitive answer as soon as possible. - The risks and benefits of a cardiac catheterization including, but not limited to, death, stroke, MI, kidney damage and bleeding were discussed with the patient and his wife who indicate understanding and agree to proceed.  -We will schedule this at the first available opportunity.  2.  Hyperlipidemia: This is followed by his PCP.  He is trying to eat a healthier diet.  If he is positive for CAD, his goal LDL drops to less than 70.  3.  Hypertension: He was extremely hypertensive during the stress  test, but had been told to hold his blood pressure medications that day. -He feels his blood pressure does not normally run as high as it is today, he thinks it is because he is anxious about the possibility of coronary artery disease. - Once the catheterization is completed, reassess goals for blood pressure management, he may need some medication changes   Current medicines are reviewed at length with the patient today.  The patient does not have concerns regarding medicines.  The following changes have been made:  no change  Labs/ tests ordered today include:   Orders Placed This Encounter  Procedures  . CBC with Differential/Platelet  . Basic metabolic panel  . EKG 12-Lead     Disposition:   FU with Carlyle Dolly, MD  Signed, Rosaria Ferries, PA-C  01/26/2018 1:29 PM    [facility] Phone: [phone]; Fax: [phone]  This note was written with the assistance of speech recognition software.  Please excuse any transcriptional errors.

## 2018-01-27 ENCOUNTER — Ambulatory Visit (INDEPENDENT_AMBULATORY_CARE_PROVIDER_SITE_OTHER): Payer: 59 | Admitting: Orthopedic Surgery

## 2018-01-27 ENCOUNTER — Telehealth: Payer: Self-pay | Admitting: *Deleted

## 2018-01-27 NOTE — Telephone Encounter (Signed)
Pt contacted pre-catheterization scheduled at [facility] for: Thursday January 28, 2018 9 AM Verified arrival time and place: [place] Entrance A at: 7 AM  No solid food after midnight prior to cath, clear liquids until 5 AM day of procedure. Contrast allergy: no Verified no diabetes medications  AM meds can be  taken pre-cath with sip of water including: ASA 81 mg  Confirmed patient has responsible person to drive home post procedure and for 24 hours after you arrive home: yes

## 2018-01-28 ENCOUNTER — Other Ambulatory Visit: Payer: Self-pay

## 2018-01-28 ENCOUNTER — Ambulatory Visit (HOSPITAL_COMMUNITY)
Admission: RE | Admit: 2018-01-28 | Discharge: 2018-01-28 | Disposition: A | Discharge status: Home / Self Care | Payer: 59 | Source: Ambulatory Visit | Attending: Interventional Cardiology | Admitting: Interventional Cardiology

## 2018-01-28 ENCOUNTER — Encounter (HOSPITAL_COMMUNITY)
Admission: RE | Disposition: A | Discharge status: Home / Self Care | Payer: Self-pay | Source: Ambulatory Visit | Attending: Interventional Cardiology

## 2018-01-28 ENCOUNTER — Encounter (HOSPITAL_COMMUNITY): Payer: Self-pay | Admitting: Interventional Cardiology

## 2018-01-28 DIAGNOSIS — E785 Hyperlipidemia, unspecified: Secondary | ICD-10-CM | POA: Diagnosis not present

## 2018-01-28 DIAGNOSIS — K219 Gastro-esophageal reflux disease without esophagitis: Secondary | ICD-10-CM | POA: Insufficient documentation

## 2018-01-28 DIAGNOSIS — I10 Essential (primary) hypertension: Secondary | ICD-10-CM | POA: Diagnosis not present

## 2018-01-28 DIAGNOSIS — R0789 Other chest pain: Secondary | ICD-10-CM | POA: Diagnosis not present

## 2018-01-28 DIAGNOSIS — Z8249 Family history of ischemic heart disease and other diseases of the circulatory system: Secondary | ICD-10-CM | POA: Insufficient documentation

## 2018-01-28 DIAGNOSIS — Z7982 Long term (current) use of aspirin: Secondary | ICD-10-CM | POA: Diagnosis not present

## 2018-01-28 DIAGNOSIS — R079 Chest pain, unspecified: Secondary | ICD-10-CM

## 2018-01-28 DIAGNOSIS — R072 Precordial pain: Secondary | ICD-10-CM | POA: Diagnosis not present

## 2018-01-28 HISTORY — PX: LEFT HEART CATH AND CORONARY ANGIOGRAPHY: CATH118249

## 2018-01-28 SURGERY — LEFT HEART CATH AND CORONARY ANGIOGRAPHY
Anesthesia: LOCAL

## 2018-01-28 MED ORDER — HEPARIN (PORCINE) IN NACL 1000-0.9 UT/500ML-% IV SOLN
INTRAVENOUS | Status: AC
  Filled 2018-01-28: qty 1000

## 2018-01-28 MED ORDER — VERAPAMIL HCL 2.5 MG/ML IV SOLN
INTRAVENOUS | Status: DC | PRN
  Administered 2018-01-28: 09:00:00 via INTRA_ARTERIAL

## 2018-01-28 MED ORDER — SODIUM CHLORIDE 0.9% FLUSH: 3.0000 mL | Freq: Two times a day (BID) | INTRAVENOUS | Status: DC

## 2018-01-28 MED ORDER — ASPIRIN 81 MG PO CHEW: 81.0000 mg | CHEWABLE_TABLET | ORAL | Status: DC

## 2018-01-28 MED ORDER — IOHEXOL 350 MG/ML SOLN
INTRAVENOUS | Status: DC | PRN
  Administered 2018-01-28: 60 mL via INTRA_ARTERIAL

## 2018-01-28 MED ORDER — FENTANYL CITRATE (PF) 100 MCG/2ML IJ SOLN
INTRAMUSCULAR | Status: DC | PRN
  Administered 2018-01-28: 25 ug via INTRAVENOUS

## 2018-01-28 MED ORDER — FENTANYL CITRATE (PF) 100 MCG/2ML IJ SOLN
INTRAMUSCULAR | Status: AC
  Filled 2018-01-28: qty 2

## 2018-01-28 MED ORDER — ACETAMINOPHEN 325 MG PO TABS: 650.0000 mg | ORAL_TABLET | ORAL | Status: DC | PRN

## 2018-01-28 MED ORDER — HEPARIN SODIUM (PORCINE) 1000 UNIT/ML IJ SOLN
INTRAMUSCULAR | Status: DC | PRN
  Administered 2018-01-28: 5500 [IU] via INTRAVENOUS

## 2018-01-28 MED ORDER — HEPARIN (PORCINE) IN NACL 1000-0.9 UT/500ML-% IV SOLN
INTRAVENOUS | Status: DC | PRN
  Administered 2018-01-28: 500 mL

## 2018-01-28 MED ORDER — SODIUM CHLORIDE 0.9 % IV SOLN: 250.0000 mL | INTRAVENOUS | Status: DC | PRN

## 2018-01-28 MED ORDER — VERAPAMIL HCL 2.5 MG/ML IV SOLN
INTRAVENOUS | Status: AC
  Filled 2018-01-28: qty 2

## 2018-01-28 MED ORDER — SODIUM CHLORIDE 0.9% FLUSH: 3.0000 mL | INTRAVENOUS | Status: DC | PRN

## 2018-01-28 MED ORDER — LIDOCAINE HCL (PF) 1 % IJ SOLN
INTRAMUSCULAR | Status: DC | PRN
  Administered 2018-01-28: 2 mL

## 2018-01-28 MED ORDER — SODIUM CHLORIDE 0.9 % WEIGHT BASED INFUSION: 1.0000 mL/kg/h | INTRAVENOUS | Status: DC

## 2018-01-28 MED ORDER — OXYCODONE HCL 5 MG PO TABS: 5.0000 mg | ORAL_TABLET | ORAL | Status: DC | PRN

## 2018-01-28 MED ORDER — MIDAZOLAM HCL 2 MG/2ML IJ SOLN
INTRAMUSCULAR | Status: DC | PRN
  Administered 2018-01-28: 1 mg via INTRAVENOUS

## 2018-01-28 MED ORDER — MIDAZOLAM HCL 2 MG/2ML IJ SOLN
INTRAMUSCULAR | Status: AC
  Filled 2018-01-28: qty 2

## 2018-01-28 MED ORDER — SODIUM CHLORIDE 0.9 % IV SOLN: INTRAVENOUS | Status: DC

## 2018-01-28 MED ORDER — LIDOCAINE HCL (PF) 1 % IJ SOLN
INTRAMUSCULAR | Status: AC
  Filled 2018-01-28: qty 30

## 2018-01-28 MED ORDER — HEPARIN SODIUM (PORCINE) 1000 UNIT/ML IJ SOLN
INTRAMUSCULAR | Status: AC
  Filled 2018-01-28: qty 1

## 2018-01-28 MED ORDER — ONDANSETRON HCL 4 MG/2ML IJ SOLN: 4.0000 mg | Freq: Four times a day (QID) | INTRAMUSCULAR | Status: DC | PRN

## 2018-01-28 MED ORDER — SODIUM CHLORIDE 0.9 % WEIGHT BASED INFUSION
3.0000 mL/kg/h | INTRAVENOUS | Status: AC
  Administered 2018-01-28: 3 mL/kg/h via INTRAVENOUS

## 2018-01-28 SURGICAL SUPPLY — 10 items
CATH 5FR JL3.5 JR4 ANG PIG MP (CATHETERS) ×2 IMPLANT
DEVICE RAD COMP TR BAND LRG (VASCULAR PRODUCTS) ×2 IMPLANT
GLIDESHEATH SLEND A-KIT 6F 22G (SHEATH) ×2 IMPLANT
GUIDEWIRE INQWIRE 1.5J.035X260 (WIRE) ×1 IMPLANT
INQWIRE 1.5J .035X260CM (WIRE) ×2
KIT HEART LEFT (KITS) ×2 IMPLANT
PACK CARDIAC CATHETERIZATION (CUSTOM PROCEDURE TRAY) ×2 IMPLANT
SHEATH PROBE COVER 6X72 (BAG) ×2 IMPLANT
TRANSDUCER W/STOPCOCK (MISCELLANEOUS) ×2 IMPLANT
TUBING CIL FLEX 10 FLL-RA (TUBING) ×2 IMPLANT

## 2018-01-28 NOTE — Interval H&P Note (Signed)
Cath Lab Visit (complete for each Cath Lab visit)  Clinical Evaluation Leading to the Procedure:   ACS: No.  Non-ACS:    Anginal Classification: CCS II  Anti-ischemic medical therapy: Minimal Therapy (1 class of medications)  Non-Invasive Test Results: Intermediate-risk stress test findings: cardiac mortality 1-3%/year  Prior CABG: No previous CABG      History and Physical Interval Note:  01/28/2018 8:33 AM  Kenneth Rowe  has presented today for surgery, with the diagnosis of as - cp  The various methods of treatment have been discussed with the patient and family. After consideration of risks, benefits and other options for treatment, the patient has consented to  Procedure(s): LEFT HEART CATH AND CORONARY ANGIOGRAPHY (N/A) as a surgical intervention .  The patient's history has been reviewed, patient examined, no change in status, stable for surgery.  I have reviewed the patient's chart and labs.  Questions were answered to the patient's satisfaction.     Belva Crome III

## 2018-01-28 NOTE — Discharge Instructions (Signed)
Drink plenty of fluids over next 48 hours and keep right wrist elevated at heart level for 24 hours ° °Radial Site Care °Refer to this sheet in the next few weeks. These instructions provide you with information about caring for yourself after your procedure. Your health care provider may also give you more specific instructions. Your treatment has been planned according to current medical practices, but problems sometimes occur. Call your health care provider if you have any problems or questions after your procedure. °What can I expect after the procedure? °After your procedure, it is typical to have the following: °· Bruising at the radial site that usually fades within 1-2 weeks. °· Blood collecting in the tissue (hematoma) that may be painful to the touch. It should usually decrease in size and tenderness within 1-2 weeks. ° °Follow these instructions at home: °· Take medicines only as directed by your health care provider. °· You may shower 24-48 hours after the procedure or as directed by your health care provider. Remove the bandage (dressing) and gently wash the site with plain soap and water. Pat the area dry with a clean towel. Do not rub the site, because this may cause bleeding. °· Do not take baths, swim, or use a hot tub until your health care provider approves. °· Check your insertion site every day for redness, swelling, or drainage. °· Do not apply powder or lotion to the site. °· Do not flex or bend the affected arm for 24 hours or as directed by your health care provider. °· Do not push or pull heavy objects with the affected arm for 24 hours or as directed by your health care provider. °· Do not lift over 10 lb (4.5 kg) for 5 days after your procedure or as directed by your health care provider. °· Ask your health care provider when it is okay to: °? Return to work or school. °? Resume usual physical activities or sports. °? Resume sexual activity. °· Do not drive home if you are discharged the  same day as the procedure. Have someone else drive you. °· You may drive 24 hours after the procedure unless otherwise instructed by your health care provider. °· Do not operate machinery or power tools for 24 hours after the procedure. °· If your procedure was done as an outpatient procedure, which means that you went home the same day as your procedure, a responsible adult should be with you for the first 24 hours after you arrive home. °· Keep all follow-up visits as directed by your health care provider. This is important. °Contact a health care provider if: °· You have a fever. °· You have chills. °· You have increased bleeding from the radial site. Hold pressure on the site. °Get help right away if: °· You have unusual pain at the radial site. °· You have redness, warmth, or swelling at the radial site. °· You have drainage (other than a small amount of blood on the dressing) from the radial site. °· The radial site is bleeding, and the bleeding does not stop after 30 minutes of holding steady pressure on the site. °· Your arm or hand becomes pale, cool, tingly, or numb. °This information is not intended to replace advice given to you by your health care provider. Make sure you discuss any questions you have with your health care provider. °Document Released: 04/19/2010 Document Revised: 08/23/2015 Document Reviewed: 10/03/2013 °Elsevier Interactive Patient Education © 2018 [employer] ° °

## 2018-02-03 ENCOUNTER — Ambulatory Visit (INDEPENDENT_AMBULATORY_CARE_PROVIDER_SITE_OTHER): Payer: 59 | Admitting: Cardiology

## 2018-02-03 ENCOUNTER — Encounter: Payer: Self-pay | Admitting: Cardiology

## 2018-02-03 VITALS — BP 126/72 | HR 79 | Ht 69.0 in | Wt 248.0 lb

## 2018-02-03 DIAGNOSIS — I10 Essential (primary) hypertension: Secondary | ICD-10-CM

## 2018-02-03 DIAGNOSIS — R0789 Other chest pain: Secondary | ICD-10-CM

## 2018-02-03 NOTE — Progress Notes (Signed)
Clinical Summary Kenneth Rowe is a 47 y.o.male  1. Chest pain - 3-4 weeks ago while in [place]  woke from sleep with pain. Left sided pain that was sharp, 5/10.  Radiated down into both arms, hands felt numb and tingling. No other associated symptoms. Not positional. Pain lasted about 30 minutes, felt tired the rest of the day. Seen in [facility]. F/u with primary, referred for stress test.  - May 2017 stress echo at [facility] regional negative for ischemia - episode of chest pain on Tuesday and Wednesday of last week. Sharp pain for just a few seconds - has had some DOE with activities he attributes to weight, deconditioning.  - CAD risk factors: Father MI age 65, paternal grandfather MI lates 99s, first cousin MI early 70s.HTN, hyperlipidemia.    - GXT by pcp 12/2017 with EKG changes suggesting possible blockage.  - at appt with Kenneth Rowe discussed options for additional testing including noninvasive stress imaging and cath, patient was in favor of cath.  -12/2017 cath no CAD - no significant pain since cath.      SH: works Geneticist, molecular, travels extensively.  Past Medical History:  Diagnosis Date  . GERD (gastroesophageal reflux disease)   . Hyperlipidemia   . Hypertension      Allergies  Allergen Reactions  . Lisinopril Cough     Current Outpatient Medications  Medication Sig Dispense Refill  . amLODipine (NORVASC) 10 MG tablet Take 10 mg by mouth daily.    Marland Kitchen aspirin EC 81 MG tablet Take 81 mg by mouth daily.     Marland Kitchen atorvastatin (LIPITOR) 40 MG tablet Take 40 mg by mouth daily.    Marland Kitchen loratadine (CLARITIN) 10 MG tablet Take 10 mg by mouth daily as needed for allergies.    Marland Kitchen losartan (COZAAR) 100 MG tablet Take 100 mg by mouth daily.    . naproxen (NAPROSYN) 500 MG tablet Take 1 tablet (500 mg total) by mouth 2 (two) times daily with a meal. (Patient taking differently: Take 500 mg by mouth 2 (two) times daily as needed for moderate  pain. ) 60 tablet 5  . Omega-3 Fatty Acids (FISH OIL) 1000 MG CAPS Take 2,000 mg by mouth daily.    Marland Kitchen omeprazole (PRILOSEC) 40 MG capsule Take 40 mg by mouth daily as needed (acid reflux).      No current facility-administered medications for this visit.      Past Surgical History:  Procedure Laterality Date  . LEFT HEART CATH AND CORONARY ANGIOGRAPHY N/A 01/28/2018   Procedure: LEFT HEART CATH AND CORONARY ANGIOGRAPHY;  Surgeon: Kenneth Crome, MD;  Location: [facility];  Service: Cardiovascular;  Laterality: N/A;  . TONSILLECTOMY  1999     Allergies  Allergen Reactions  . Lisinopril Cough      Family History  Problem Relation Age of Onset  . Hypertension Mother   . Hypertension Father   . Heart attack Father 54       first MI at 62  . Diabetes Maternal Grandmother   . Heart attack Paternal Grandfather      Social History Kenneth Rowe reports that he has never smoked. He has never used smokeless tobacco. Kenneth Rowe has no alcohol history on file.   Review of Systems CONSTITUTIONAL: No weight loss, fever, chills, weakness or fatigue.  HEENT: Eyes: No visual loss, blurred vision, double vision or yellow sclerae.No hearing loss, sneezing, congestion, runny nose or sore throat.  SKIN: No rash or itching.  CARDIOVASCULAR: per hpi RESPIRATORY: No shortness of breath, cough or sputum.  GASTROINTESTINAL: No anorexia, nausea, vomiting or diarrhea. No abdominal pain or blood.  GENITOURINARY: No burning on urination, no polyuria NEUROLOGICAL: No headache, dizziness, syncope, paralysis, ataxia, numbness or tingling in the extremities. No change in bowel or bladder control.  MUSCULOSKELETAL: No muscle, back pain, joint pain or stiffness.  LYMPHATICS: No enlarged nodes. No history of splenectomy.  PSYCHIATRIC: No history of depression or anxiety.  ENDOCRINOLOGIC: No reports of sweating, cold or heat intolerance. No polyuria or polydipsia.  Marland Kitchen   Physical  Examination Vitals:   02/03/18 1129  BP: 126/72  Pulse: 79  SpO2: 96%   Vitals:   02/03/18 1129  Weight: 248 lb (112.5 kg)  Height: 5' 9"  (1.753 m)    Gen: resting comfortably, no acute distress HEENT: no scleral icterus, pupils equal round and reactive, no palptable cervical adenopathy,  CV: RRR, no m/rg, no jvd Resp: Clear to auscultation bilaterally GI: abdomen is soft, non-tender, non-distended, normal bowel sounds, no hepatosplenomegaly MSK: extremities are warm, no edema.  Skin: warm, no rash Neuro:  no focal deficits Psych: appropriate affect   Diagnostic Studies  12/2017 cath  Right dominant coronary anatomy  Normal left main  Normal LAD  Normal circumflex and ramus intermedius  Dominant right coronary which is also normal  Normal left ventricular function with EF 55 to 60%.  EDP 15 mmHg.  Chest pain does not appear to be on the basis of myocardial ischemia.  RECOMMENDATIONS:   Per treating team.    Assessment and Plan   1. Chest pain - several year history of chest pains. He has had a definitive cath recently that shows no significant CAD, noncardiac chest pain defer further evaluation to pcp - no further cardiac testing at this time, continue CAD risk factor modification  2. HTN - at goal, continue current meds  F/u as needed.      Arnoldo Lenis, M.D.

## 2018-02-03 NOTE — Patient Instructions (Signed)
Your physician recommends that you schedule a follow-up appointment in: AS NEEDED WITH DR Sandy has recommended you make the following change in your medication:   STOP ASPIRIN   Thank you for choosing [facility]!!

## 2018-02-08 ENCOUNTER — Ambulatory Visit (INDEPENDENT_AMBULATORY_CARE_PROVIDER_SITE_OTHER): Payer: 59 | Admitting: Orthopedic Surgery

## 2018-02-08 ENCOUNTER — Encounter (INDEPENDENT_AMBULATORY_CARE_PROVIDER_SITE_OTHER): Payer: Self-pay | Admitting: Orthopedic Surgery

## 2018-02-08 VITALS — Ht 69.0 in | Wt 248.0 lb

## 2018-02-08 DIAGNOSIS — M7541 Impingement syndrome of right shoulder: Secondary | ICD-10-CM

## 2018-02-08 NOTE — Progress Notes (Signed)
Office Visit Note   Patient: Kenneth Rowe           Date of Birth: [birth year]           MRN: [id number] Visit Date: 02/08/2018              Requested by: Caryl Bis, MD [place], [place] [address] PCP: Caryl Bis, MD  Chief Complaint  Patient presents with  . Right Shoulder - Pain, Follow-up      HPI: Patient is a 46 year old gentleman who has impingement symptoms of the right shoulder pain radiating down his biceps.  He states that the previous injection provided very minimal relief.  He states he has occasional pain with normal activities pain with reaching overhead pain with reaching behind himself.  Patient states 11 days ago he had chest pain he underwent coronary angioplasty and this was interpreted as normal.  Assessment & Plan: Visit Diagnoses:  1. Impingement syndrome of right shoulder     Plan: We will obtain an MRI scan to further evaluate his rotator cuff and labrum of the right shoulder.  Discussed that he may benefit from arthroscopic intervention follow-up after the MRI scan.  Follow-Up Instructions: Return in about 2 weeks (around 02/22/2018).   Ortho Exam  Patient is alert, oriented, no adenopathy, well-dressed, normal affect, normal respiratory effort. Examination patient has full range of motion of the right shoulder.  He has some mild tenderness to palpation of the biceps tendon he has some mild tenderness with Neer and Hawkins impingement test mild pain with impingement test for the labrum.  Imaging: No results found. No images are attached to the encounter.  Labs: No results found for: HGBA1C, ESRSEDRATE, CRP, LABURIC, REPTSTATUS, GRAMSTAIN, CULT, LABORGA   No results found for: ALBUMIN, PREALBUMIN, LABURIC  Body mass index is 36.62 kg/m.  Orders:  No orders of the defined types were placed in this encounter.  No orders of the defined types were placed in this encounter.    Procedures: No procedures  performed  Clinical Data: No additional findings.  ROS:  All other systems negative, except as noted in the HPI. Review of Systems  Objective: Vital Signs: Ht 5' 9"  (1.753 m)   Wt 248 lb (112.5 kg)   BMI 36.62 kg/m   Specialty Comments:  No specialty comments available.  PMFS History: Patient Active Problem List   Diagnosis Date Noted  . Chest pain 01/28/2018   Past Medical History:  Diagnosis Date  . GERD (gastroesophageal reflux disease)   . Hyperlipidemia   . Hypertension     Family History  Problem Relation Age of Onset  . Hypertension Mother   . Hypertension Father   . Heart attack Father 42       first MI at 23  . Diabetes Maternal Grandmother   . Heart attack Paternal Grandfather     Past Surgical History:  Procedure Laterality Date  . LEFT HEART CATH AND CORONARY ANGIOGRAPHY N/A 01/28/2018   Procedure: LEFT HEART CATH AND CORONARY ANGIOGRAPHY;  Surgeon: Belva Crome, MD;  Location: [facility];  Service: Cardiovascular;  Laterality: N/A;  . TONSILLECTOMY  1999   Social History   Occupational History    CommentGarment/textile technologist  Tobacco Use  . Smoking status: Never Smoker  . Smokeless tobacco: Never Used  Substance and Sexual Activity  . Alcohol use: Not on file  . Drug use: Not on file  . Sexual activity:  Not on file       

## 2018-02-26 ENCOUNTER — Ambulatory Visit: Payer: 59 | Admitting: Cardiology

## 2018-02-26 ENCOUNTER — Encounter

## 2018-03-01 ENCOUNTER — Other Ambulatory Visit (INDEPENDENT_AMBULATORY_CARE_PROVIDER_SITE_OTHER): Payer: Self-pay

## 2018-03-01 ENCOUNTER — Telehealth (INDEPENDENT_AMBULATORY_CARE_PROVIDER_SITE_OTHER): Payer: Self-pay | Admitting: Orthopedic Surgery

## 2018-03-01 DIAGNOSIS — M7541 Impingement syndrome of right shoulder: Secondary | ICD-10-CM

## 2018-03-01 NOTE — Telephone Encounter (Signed)
Thank you so much

## 2018-03-01 NOTE — Telephone Encounter (Signed)
Thanks

## 2018-03-01 NOTE — Telephone Encounter (Signed)
Pt is already scheduled for the 13 Dec. It looks like it was ok by pt to go ahead and get it done at this time.

## 2018-03-01 NOTE — Telephone Encounter (Signed)
Patient called a little upset that no one has called about the MRI.  Per Dda notes on 02/08/18 one would be ordered for the rotate cuff.  Please make sure order is put in and patient called before end of year per pt for deductible purposed.  Please call patient to advise.

## 2018-03-01 NOTE — Telephone Encounter (Signed)
This is an urgent request. The pt was to have an MRI sch 02/08/18. Order was not entered in the chart and the pt is wanting to get this done ASAP. Is there anyway that we can get this set up this week?

## 2018-03-12 ENCOUNTER — Ambulatory Visit
Admission: RE | Admit: 2018-03-12 | Discharge: 2018-03-12 | Disposition: A | Discharge status: Home / Self Care | Payer: 59 | Source: Ambulatory Visit | Attending: Orthopedic Surgery | Admitting: Orthopedic Surgery

## 2018-03-12 DIAGNOSIS — M7541 Impingement syndrome of right shoulder: Secondary | ICD-10-CM

## 2018-03-12 DIAGNOSIS — M75101 Unspecified rotator cuff tear or rupture of right shoulder, not specified as traumatic: Secondary | ICD-10-CM | POA: Diagnosis not present

## 2018-03-18 ENCOUNTER — Ambulatory Visit (INDEPENDENT_AMBULATORY_CARE_PROVIDER_SITE_OTHER): Payer: 59 | Admitting: Orthopedic Surgery

## 2018-03-18 ENCOUNTER — Encounter (INDEPENDENT_AMBULATORY_CARE_PROVIDER_SITE_OTHER): Payer: Self-pay | Admitting: Orthopedic Surgery

## 2018-03-18 VITALS — Ht 69.0 in | Wt 248.0 lb

## 2018-03-18 DIAGNOSIS — M7541 Impingement syndrome of right shoulder: Secondary | ICD-10-CM | POA: Diagnosis not present

## 2018-03-19 ENCOUNTER — Encounter (INDEPENDENT_AMBULATORY_CARE_PROVIDER_SITE_OTHER): Payer: Self-pay | Admitting: Orthopedic Surgery

## 2018-03-19 NOTE — Progress Notes (Signed)
Office Visit Note   Patient: Kenneth Rowe           Date of Birth: 10/19/1970           MRN: [id number] Visit Date: 03/18/2018              Requested by: Caryl Bis, MD [place], [place] [address] PCP: Caryl Bis, MD  Chief Complaint  Patient presents with  . Right Shoulder - Follow-up    MRI review      HPI: Patient is a 47 year old gentleman with persistent pain with the right shoulder pain with overhead activities pain with reaching to the side.  Patient states he has had no relief with the subacromial injection in February.  He is status post MRI scan he states that nonsteroidals to help.  Assessment & Plan: Visit Diagnoses:  1. Impingement syndrome of right shoulder     Plan: Discussed with the patient we can continue with conservative therapy versus arthroscopic debridement of rotator cuff debridement SLAP lesion and subacromial decompression.  Risk and benefits of surgery were discussed patient states he understands wished to proceed at this time.  Follow-Up Instructions: Return in about 2 weeks (around 04/01/2018).   Ortho Exam  Patient is alert, oriented, no adenopathy, well-dressed, normal affect, normal respiratory effort. Examination patient has full range of motion of the right shoulder he has pain to palpation over the biceps tendon he has pain with Neer and Hawkins impingement test pain with a drop arm test.  Review of the MRI scan shows tendinopathy of the rotator cuff with partial tearing of the articular surface no full-thickness retracted tear he also has a tear of the labrum the long head of the biceps is intact.  Imaging: No results found. No images are attached to the encounter.  Labs: No results found for: HGBA1C, ESRSEDRATE, CRP, LABURIC, REPTSTATUS, GRAMSTAIN, CULT, LABORGA   No results found for: ALBUMIN, PREALBUMIN, LABURIC  Body mass index is 36.62 kg/m.  Orders:  No orders of the defined types were placed in this  encounter.  No orders of the defined types were placed in this encounter.    Procedures: No procedures performed  Clinical Data: No additional findings.  ROS:  All other systems negative, except as noted in the HPI. Review of Systems  Objective: Vital Signs: Ht 5' 9"  (1.753 m)   Wt 248 lb (112.5 kg)   BMI 36.62 kg/m   Specialty Comments:  No specialty comments available.  PMFS History: Patient Active Problem List   Diagnosis Date Noted  . Chest pain 01/28/2018   Past Medical History:  Diagnosis Date  . GERD (gastroesophageal reflux disease)   . Hyperlipidemia   . Hypertension     Family History  Problem Relation Age of Onset  . Hypertension Mother   . Hypertension Father   . Heart attack Father 91       first MI at 42  . Diabetes Maternal Grandmother   . Heart attack Paternal Grandfather     Past Surgical History:  Procedure Laterality Date  . LEFT HEART CATH AND CORONARY ANGIOGRAPHY N/A 01/28/2018   Procedure: LEFT HEART CATH AND CORONARY ANGIOGRAPHY;  Surgeon: Belva Crome, MD;  Location: [facility];  Service: Cardiovascular;  Laterality: N/A;  . TONSILLECTOMY  1999   Social History   Occupational History    CommentGarment/textile technologist  Tobacco Use  . Smoking status: Never Smoker  . Smokeless tobacco:  Never Used  Substance and Sexual Activity  . Alcohol use: Not on file  . Drug use: Not on file  . Sexual activity: Not on file

## 2018-03-23 ENCOUNTER — Ambulatory Visit: Payer: 59 | Admitting: Cardiology

## 2018-04-20 ENCOUNTER — Ambulatory Visit (INDEPENDENT_AMBULATORY_CARE_PROVIDER_SITE_OTHER): Payer: Self-pay | Admitting: Physician Assistant

## 2018-05-13 NOTE — Pre-Procedure Instructions (Signed)
JUVENTINO PAVONE  05/13/2018      [facility] - [place], [address] [place] [address] Phone: [phone] Fax: [phone]    Your procedure is scheduled on May 21, 2018.  Report to [facility] Admitting at 0730 A.M.  Call this number if you have problems the morning of surgery:  [phone]   Remember:  Do not eat or drink after midnight.     Take these medicines the morning of surgery with A SIP OF WATER  Amlodipine (Norvasc) Loratadine (Claritin) Omeprazole (Prilosec)    Do not wear jewelry.  Do not wear lotions, powders, cologne, or deodorant.  Do not shave 48 hours prior to surgery.  Men may shave face and neck.  Do not bring valuables to the hospital.  [facility] is not responsible for any belongings or valuables.   [place]- Preparing For Surgery  Before surgery, you can play an important role. Because skin is not sterile, your skin needs to be as free of germs as possible. You can reduce the number of germs on your skin by washing with CHG (chlorahexidine gluconate) Soap before surgery.  CHG is an antiseptic cleaner which kills germs and bonds with the skin to continue killing germs even after washing.    Oral Hygiene is also important to reduce your risk of infection.  Remember - BRUSH YOUR TEETH THE MORNING OF SURGERY WITH YOUR REGULAR TOOTHPASTE  Please do not use if you have an allergy to CHG or antibacterial soaps. If your skin becomes reddened/irritated stop using the CHG.  Do not shave (including legs and underarms) for at least 48 hours prior to first CHG shower. It is OK to shave your face.  Please follow these instructions carefully.   1. Shower the NIGHT BEFORE SURGERY and the MORNING OF SURGERY with CHG.   2. If you chose to wash your hair, wash your hair first as usual with your normal shampoo.  3. After you shampoo, rinse your hair and body thoroughly to remove the  shampoo.  4. Use CHG as you would any other liquid soap. You can apply CHG directly to the skin and wash gently with a scrungie or a clean washcloth.   5. Apply the CHG Soap to your body ONLY FROM THE NECK DOWN.  Do not use on open wounds or open sores. Avoid contact with your eyes, ears, mouth and genitals (private parts). Wash Face and genitals (private parts)  with your normal soap.  6. Wash thoroughly, paying special attention to the area where your surgery will be performed.  7. Thoroughly rinse your body with warm water from the neck down.  8. DO NOT shower/wash with your normal soap after using and rinsing off the CHG Soap.  9. Pat yourself dry with a CLEAN TOWEL.  10. Wear CLEAN PAJAMAS to bed the night before surgery, wear comfortable clothes the morning of surgery  11. Place CLEAN SHEETS on your bed the night of your first shower and DO NOT SLEEP WITH PETS.    Day of Surgery:  Do not apply any deodorants/lotions.  Please wear clean clothes to the hospital/surgery center.   Remember to brush your teeth WITH YOUR REGULAR TOOTHPASTE.  Contacts, dentures or bridgework may not be worn into surgery.  Leave your suitcase in the car.  After surgery it may be brought to your room.  For patients admitted to the hospital, discharge  time will be determined by your treatment team.  Patients discharged the day of surgery will not be allowed to drive home.

## 2018-05-14 ENCOUNTER — Encounter (HOSPITAL_COMMUNITY)
Admission: RE | Admit: 2018-05-14 | Discharge: 2018-05-14 | Disposition: A | Discharge status: Home / Self Care | Payer: 59 | Source: Ambulatory Visit | Attending: Orthopedic Surgery | Admitting: Orthopedic Surgery

## 2018-05-14 ENCOUNTER — Other Ambulatory Visit: Payer: Self-pay

## 2018-05-14 ENCOUNTER — Encounter (HOSPITAL_COMMUNITY): Payer: Self-pay

## 2018-05-14 DIAGNOSIS — Z01812 Encounter for preprocedural laboratory examination: Secondary | ICD-10-CM | POA: Insufficient documentation

## 2018-05-14 LAB — BASIC METABOLIC PANEL
Anion gap: 14 (ref 5–15)
BUN: 9 mg/dL (ref 6–20)
CALCIUM: 9 mg/dL (ref 8.9–10.3)
CO2: 22 mmol/L (ref 22–32)
Chloride: 104 mmol/L (ref 98–111)
Creatinine, Ser: 1.11 mg/dL (ref 0.61–1.24)
GFR calc Af Amer: >60 mL/min (ref >60)
GFR calc non Af Amer: >60 mL/min (ref >60)
Glucose, Bld: 203 mg/dL — ABNORMAL HIGH (ref 70–99)
Potassium: 3.4 mmol/L — ABNORMAL LOW (ref 3.5–5.1)
Sodium: 140 mmol/L (ref 135–145)

## 2018-05-14 LAB — CBC
HCT: 45.7 % (ref 39.0–52.0)
Hemoglobin: 15.2 g/dL (ref 13.0–17.0)
MCH: 29.6 pg (ref 26.0–34.0)
MCHC: 33.3 g/dL (ref 30.0–36.0)
MCV: 89.1 fL (ref 80.0–100.0)
Platelets: 213 10*3/uL (ref 150–400)
RBC: 5.13 MIL/uL (ref 4.22–5.81)
RDW: 12 % (ref 11.5–15.5)
WBC: 7.3 10*3/uL (ref 4.0–10.5)
nRBC: 0 % (ref 0.0–0.2)

## 2018-05-14 NOTE — Progress Notes (Signed)
PCP - [facility] Cardiologist - [place]  Chest x-ray - N/A EKG - 01-26-18 Stress Test - 12-2017 ECHO - 01-11-18 Cardiac Cath - 01-28-18    Anesthesia review: yes, heart history  Patient denies shortness of breath, fever, cough and chest pain at PAT appointment   Patient verbalized understanding of instructions that were given to them at the PAT appointment. Patient was also instructed that they will need to review over the PAT instructions again at home before surgery.

## 2018-05-17 NOTE — Anesthesia Preprocedure Evaluation (Addendum)
Anesthesia Evaluation  Patient identified by MRN, date of birth, ID band Patient awake    Reviewed: Allergy & Precautions, NPO status , Patient's Chart, lab work & pertinent test results  Airway Mallampati: III  TM Distance: >3 FB Neck ROM: Full    Dental  (+) Teeth Intact, Dental Advisory Given   Pulmonary    breath sounds clear to auscultation       Cardiovascular hypertension,  Rhythm:Regular Rate:Normal     Neuro/Psych    GI/Hepatic   Endo/Other    Renal/GU      Musculoskeletal   Abdominal (+) + obese,   Peds  Hematology   Anesthesia Other Findings   Reproductive/Obstetrics                            Anesthesia Physical Anesthesia Plan  ASA: III  Anesthesia Plan: General   Post-op Pain Management:  Regional for Post-op pain   Induction: Intravenous  PONV Risk Score and Plan: Ondansetron and Dexamethasone  Airway Management Planned: Oral ETT  Additional Equipment:   Intra-op Plan:   Post-operative Plan: Extubation in OR  Informed Consent: I have reviewed the patients History and Physical, chart, labs and discussed the procedure including the risks, benefits and alternatives for the proposed anesthesia with the patient or authorized representative who has indicated his/her understanding and acceptance.     Dental advisory given  Plan Discussed with: CRNA and Anesthesiologist  Anesthesia Plan Comments: (Recent cardiology workup for chest pain. Per note by Dr. Harl Bowie 02/03/2018 "Several year history of chest pains. He has had a definitive cath recently that shows no significant CAD, noncardiac chest pain defer further evaluation to pcp. No further cardiac testing at this time, continue CAD risk factor modification." )       Anesthesia Quick Evaluation

## 2018-05-20 MED ORDER — DEXTROSE 5 % IV SOLN
3.0000 g | INTRAVENOUS | Status: AC
  Administered 2018-05-21: 3 g via INTRAVENOUS
  Filled 2018-05-20: qty 3

## 2018-05-21 ENCOUNTER — Ambulatory Visit (HOSPITAL_COMMUNITY): Payer: 59 | Admitting: Anesthesiology

## 2018-05-21 ENCOUNTER — Other Ambulatory Visit: Payer: Self-pay

## 2018-05-21 ENCOUNTER — Ambulatory Visit (INDEPENDENT_AMBULATORY_CARE_PROVIDER_SITE_OTHER): Payer: Self-pay | Admitting: Physician Assistant

## 2018-05-21 ENCOUNTER — Encounter (HOSPITAL_COMMUNITY)
Admission: RE | Disposition: A | Discharge status: Home / Self Care | Payer: Self-pay | Source: Home / Self Care | Attending: Orthopedic Surgery

## 2018-05-21 ENCOUNTER — Ambulatory Visit (HOSPITAL_COMMUNITY): Payer: 59 | Admitting: Physician Assistant

## 2018-05-21 ENCOUNTER — Encounter (HOSPITAL_COMMUNITY): Payer: Self-pay | Admitting: Surgery

## 2018-05-21 ENCOUNTER — Ambulatory Visit (HOSPITAL_COMMUNITY)
Admission: RE | Admit: 2018-05-21 | Discharge: 2018-05-21 | Disposition: A | Discharge status: Home / Self Care | Payer: 59 | Attending: Orthopedic Surgery | Admitting: Orthopedic Surgery

## 2018-05-21 ENCOUNTER — Telehealth (INDEPENDENT_AMBULATORY_CARE_PROVIDER_SITE_OTHER): Payer: Self-pay | Admitting: Orthopedic Surgery

## 2018-05-21 DIAGNOSIS — M75121 Complete rotator cuff tear or rupture of right shoulder, not specified as traumatic: Secondary | ICD-10-CM

## 2018-05-21 DIAGNOSIS — E785 Hyperlipidemia, unspecified: Secondary | ICD-10-CM | POA: Diagnosis not present

## 2018-05-21 DIAGNOSIS — K219 Gastro-esophageal reflux disease without esophagitis: Secondary | ICD-10-CM | POA: Diagnosis not present

## 2018-05-21 DIAGNOSIS — M75101 Unspecified rotator cuff tear or rupture of right shoulder, not specified as traumatic: Secondary | ICD-10-CM | POA: Diagnosis not present

## 2018-05-21 DIAGNOSIS — X58XXXA Exposure to other specified factors, initial encounter: Secondary | ICD-10-CM | POA: Diagnosis not present

## 2018-05-21 DIAGNOSIS — Z8249 Family history of ischemic heart disease and other diseases of the circulatory system: Secondary | ICD-10-CM | POA: Insufficient documentation

## 2018-05-21 DIAGNOSIS — S43431A Superior glenoid labrum lesion of right shoulder, initial encounter: Secondary | ICD-10-CM | POA: Insufficient documentation

## 2018-05-21 DIAGNOSIS — I10 Essential (primary) hypertension: Secondary | ICD-10-CM | POA: Insufficient documentation

## 2018-05-21 DIAGNOSIS — Z79899 Other long term (current) drug therapy: Secondary | ICD-10-CM | POA: Insufficient documentation

## 2018-05-21 DIAGNOSIS — G8918 Other acute postprocedural pain: Secondary | ICD-10-CM | POA: Diagnosis not present

## 2018-05-21 DIAGNOSIS — M7541 Impingement syndrome of right shoulder: Secondary | ICD-10-CM

## 2018-05-21 DIAGNOSIS — M75111 Incomplete rotator cuff tear or rupture of right shoulder, not specified as traumatic: Secondary | ICD-10-CM | POA: Diagnosis not present

## 2018-05-21 HISTORY — PX: SHOULDER ARTHROSCOPY: SHX128

## 2018-05-21 LAB — GLUCOSE, CAPILLARY: Glucose-Capillary: 93 mg/dL (ref 70–99)

## 2018-05-21 SURGERY — ARTHROSCOPY, SHOULDER
Anesthesia: General | Site: Shoulder | Laterality: Right

## 2018-05-21 MED ORDER — FENTANYL CITRATE (PF) 100 MCG/2ML IJ SOLN: 25.0000 ug | INTRAMUSCULAR | Status: DC | PRN

## 2018-05-21 MED ORDER — BUPIVACAINE HCL (PF) 0.25 % IJ SOLN
INTRAMUSCULAR | Status: AC
  Filled 2018-05-21: qty 30

## 2018-05-21 MED ORDER — PHENYLEPHRINE 40 MCG/ML (10ML) SYRINGE FOR IV PUSH (FOR BLOOD PRESSURE SUPPORT)
PREFILLED_SYRINGE | INTRAVENOUS | Status: AC
  Filled 2018-05-21: qty 10

## 2018-05-21 MED ORDER — PHENYLEPHRINE 40 MCG/ML (10ML) SYRINGE FOR IV PUSH (FOR BLOOD PRESSURE SUPPORT)
PREFILLED_SYRINGE | INTRAVENOUS | Status: DC | PRN
  Administered 2018-05-21 (×4): 80 ug via INTRAVENOUS

## 2018-05-21 MED ORDER — OXYCODONE-ACETAMINOPHEN 5-325 MG PO TABS: 1.0000 | ORAL_TABLET | ORAL | 0 refills | Status: AC | PRN

## 2018-05-21 MED ORDER — PROPOFOL 10 MG/ML IV BOLUS
INTRAVENOUS | Status: AC
  Filled 2018-05-21: qty 20

## 2018-05-21 MED ORDER — DEXAMETHASONE SODIUM PHOSPHATE 10 MG/ML IJ SOLN
INTRAMUSCULAR | Status: AC
  Filled 2018-05-21: qty 1

## 2018-05-21 MED ORDER — PROPOFOL 10 MG/ML IV BOLUS
INTRAVENOUS | Status: DC | PRN
  Administered 2018-05-21: 170 mg via INTRAVENOUS

## 2018-05-21 MED ORDER — ROCURONIUM BROMIDE 10 MG/ML (PF) SYRINGE
PREFILLED_SYRINGE | INTRAVENOUS | Status: DC | PRN
  Administered 2018-05-21: 50 mg via INTRAVENOUS

## 2018-05-21 MED ORDER — ONDANSETRON HCL 4 MG/2ML IJ SOLN
INTRAMUSCULAR | Status: DC | PRN
  Administered 2018-05-21: 4 mg via INTRAVENOUS

## 2018-05-21 MED ORDER — CHLORHEXIDINE GLUCONATE 4 % EX LIQD: 60.0000 mL | Freq: Once | CUTANEOUS | Status: DC

## 2018-05-21 MED ORDER — SODIUM CHLORIDE 0.9 % IR SOLN
Status: DC | PRN
  Administered 2018-05-21 (×2): 3000 mL

## 2018-05-21 MED ORDER — FENTANYL CITRATE (PF) 250 MCG/5ML IJ SOLN
INTRAMUSCULAR | Status: AC
  Filled 2018-05-21: qty 5

## 2018-05-21 MED ORDER — OXYCODONE HCL 5 MG PO TABS: 5.0000 mg | ORAL_TABLET | Freq: Once | ORAL | Status: DC | PRN

## 2018-05-21 MED ORDER — OXYCODONE HCL 5 MG/5ML PO SOLN: 5.0000 mg | Freq: Once | ORAL | Status: DC | PRN

## 2018-05-21 MED ORDER — FENTANYL CITRATE (PF) 250 MCG/5ML IJ SOLN
INTRAMUSCULAR | Status: DC | PRN
  Administered 2018-05-21: 25 ug via INTRAVENOUS
  Administered 2018-05-21: 50 ug via INTRAVENOUS
  Administered 2018-05-21: 25 ug via INTRAVENOUS
  Administered 2018-05-21: 50 ug via INTRAVENOUS

## 2018-05-21 MED ORDER — LACTATED RINGERS IV SOLN
INTRAVENOUS | Status: DC | PRN
  Administered 2018-05-21: 07:00:00 via INTRAVENOUS

## 2018-05-21 MED ORDER — ROCURONIUM BROMIDE 50 MG/5ML IV SOSY
PREFILLED_SYRINGE | INTRAVENOUS | Status: AC
  Filled 2018-05-21: qty 5

## 2018-05-21 MED ORDER — MIDAZOLAM HCL 2 MG/2ML IJ SOLN
INTRAMUSCULAR | Status: DC | PRN
  Administered 2018-05-21 (×2): 1 mg via INTRAVENOUS

## 2018-05-21 MED ORDER — LIDOCAINE 2% (20 MG/ML) 5 ML SYRINGE
INTRAMUSCULAR | Status: AC
  Filled 2018-05-21: qty 5

## 2018-05-21 MED ORDER — LIDOCAINE 2% (20 MG/ML) 5 ML SYRINGE
INTRAMUSCULAR | Status: DC | PRN
  Administered 2018-05-21: 100 mg via INTRAVENOUS

## 2018-05-21 MED ORDER — ONDANSETRON HCL 4 MG/2ML IJ SOLN: 4.0000 mg | Freq: Once | INTRAMUSCULAR | Status: DC | PRN

## 2018-05-21 MED ORDER — MIDAZOLAM HCL 2 MG/2ML IJ SOLN
INTRAMUSCULAR | Status: AC
  Filled 2018-05-21: qty 2

## 2018-05-21 MED ORDER — ONDANSETRON HCL 4 MG/2ML IJ SOLN
INTRAMUSCULAR | Status: AC
  Filled 2018-05-21: qty 2

## 2018-05-21 MED ORDER — SUGAMMADEX SODIUM 200 MG/2ML IV SOLN
INTRAVENOUS | Status: DC | PRN
  Administered 2018-05-21: 200 mg via INTRAVENOUS

## 2018-05-21 MED ORDER — 0.9 % SODIUM CHLORIDE (POUR BTL) OPTIME
TOPICAL | Status: DC | PRN
  Administered 2018-05-21: 1000 mL

## 2018-05-21 MED ORDER — DEXAMETHASONE SODIUM PHOSPHATE 10 MG/ML IJ SOLN
INTRAMUSCULAR | Status: DC | PRN
  Administered 2018-05-21: 5 mg via INTRAVENOUS

## 2018-05-21 SURGICAL SUPPLY — 37 items
BLADE EXCALIBUR 4.0X13 (MISCELLANEOUS) ×2 IMPLANT
BLADE GREAT WHITE 4.2 (BLADE) IMPLANT
BUR OVAL 6.0 (BURR) IMPLANT
BURR OVAL 8 FLU 4.0X13 (MISCELLANEOUS) ×2 IMPLANT
CANNULA SHOULDER 7CM (CANNULA) ×2 IMPLANT
COVER SURGICAL LIGHT HANDLE (MISCELLANEOUS) ×4 IMPLANT
COVER WAND RF STERILE (DRAPES) ×2 IMPLANT
DRAPE STERI 35X30 U-POUCH (DRAPES) ×2 IMPLANT
DRAPE U-SHAPE 47X51 STRL (DRAPES) ×2 IMPLANT
DRSG EMULSION OIL 3X3 NADH (GAUZE/BANDAGES/DRESSINGS) ×2 IMPLANT
DRSG PAD ABDOMINAL 8X10 ST (GAUZE/BANDAGES/DRESSINGS) ×4 IMPLANT
DURAPREP 26ML APPLICATOR (WOUND CARE) ×2 IMPLANT
GAUZE SPONGE 4X4 12PLY STRL (GAUZE/BANDAGES/DRESSINGS) ×2 IMPLANT
GAUZE SPONGE 4X4 12PLY STRL LF (GAUZE/BANDAGES/DRESSINGS) ×2 IMPLANT
GLOVE BIOGEL PI IND STRL 9 (GLOVE) ×1 IMPLANT
GLOVE BIOGEL PI INDICATOR 9 (GLOVE) ×1
GLOVE SURG ORTHO 9.0 STRL STRW (GLOVE) ×2 IMPLANT
GOWN STRL REUS W/ TWL XL LVL3 (GOWN DISPOSABLE) ×2 IMPLANT
GOWN STRL REUS W/TWL XL LVL3 (GOWN DISPOSABLE) ×2
KIT BASIN OR (CUSTOM PROCEDURE TRAY) ×2 IMPLANT
KIT TURNOVER KIT B (KITS) ×2 IMPLANT
MANIFOLD NEPTUNE II (INSTRUMENTS) ×2 IMPLANT
NEEDLE SPNL 18GX3.5 QUINCKE PK (NEEDLE) ×2 IMPLANT
NS IRRIG 1000ML POUR BTL (IV SOLUTION) ×2 IMPLANT
PACK SHOULDER (CUSTOM PROCEDURE TRAY) ×2 IMPLANT
PAD ABD 8X10 STRL (GAUZE/BANDAGES/DRESSINGS) ×4 IMPLANT
PAD ARMBOARD 7.5X6 YLW CONV (MISCELLANEOUS) ×4 IMPLANT
PROBE APOLLO 90XL (SURGICAL WAND) ×2 IMPLANT
SET ARTHROSCOPY TUBING (MISCELLANEOUS) ×1
SET ARTHROSCOPY TUBING LN (MISCELLANEOUS) ×1 IMPLANT
SLING ARM IMMOBILIZER XL (CAST SUPPLIES) ×2 IMPLANT
SUT ETHILON 2 0 FS 18 (SUTURE) ×2 IMPLANT
TAPE CLOTH SURG 6X10 WHT LF (GAUZE/BANDAGES/DRESSINGS) ×2 IMPLANT
TOWEL OR 17X24 6PK STRL BLUE (TOWEL DISPOSABLE) ×4 IMPLANT
TUBING ARTHROSCOPY IRRIG 16FT (MISCELLANEOUS) ×2 IMPLANT
WAND STAR VAC 90 (SURGICAL WAND) IMPLANT
WATER STERILE IRR 1000ML POUR (IV SOLUTION) ×2 IMPLANT

## 2018-05-21 NOTE — Telephone Encounter (Signed)
Pt is s/p a shoulder scope, decomp and debridement  Please see message below and advise

## 2018-05-21 NOTE — Telephone Encounter (Signed)
I called and sw pt's wife to advise. Voiced understanding and will call with any questions.

## 2018-05-21 NOTE — Telephone Encounter (Signed)
Patient's wife Angie called to let Dr Sharol Given know that patient's right thumb is still numb and the rest of his fingers he can feel just fine. The number to contact Angie is [phone]

## 2018-05-21 NOTE — Op Note (Signed)
05/21/2018  8:44 AM  PATIENT:  Kenneth Rowe    PRE-OPERATIVE DIAGNOSIS:  Partial Rotator Cuff Tear Right Shoulder  POST-OPERATIVE DIAGNOSIS: Massive retracted rotator cuff tear with tear of the subscapularis osteochondral defects of the glenoid and humeral head with a large hook 3 acromion.  PROCEDURE:  RIGHT SHOULDER ARTHROSCOPY, DEBRIDEMENT, AND DECOMPRESSION with debridement of the massive rotator cuff tear subacromial decompression with acromioplasty with debridement of the subscapularis debridement of the osteochondral defects.  SURGEON:  Newt Minion, MD  PHYSICIAN ASSISTANT:None ANESTHESIA:   General  PREOPERATIVE INDICATIONS:  Kenneth Rowe is a  48 y.o. male with a diagnosis of Partial Rotator Cuff Tear Right Shoulder who failed conservative measures and elected for surgical management.    The risks benefits and alternatives were discussed with the patient preoperatively including but not limited to the risks of infection, bleeding, nerve injury, cardiopulmonary complications, the need for revision surgery, among others, and the patient was willing to proceed.  OPERATIVE IMPLANTS: None  @ENCIMAGES @  OPERATIVE FINDINGS: Massive rotator cuff tear with hook type III acromium subscapularis tear and osteochondral flap tears of the glenoid and humeral head  OPERATIVE PROCEDURE: Patient was brought the operating room underwent general anesthetic after a interscalene block with Exparel.  After adequate levels anesthesia obtained patient was placed in the beachchair position and the right upper extremity was prepped using DuraPrep draped into a sterile field a timeout was called.  A scope was inserted from the posterior portal and an anterior portal was established with outside in technique with an 18-gauge spinal.  Visualization showed flap tearing of the cartilage of the glenoid and humeral head.  This was debrided with the shaver.  Patient had partial tearing of the  subscapularis which was debrided with the shaver and the electrical wand.  Patient had a massive tear retracted of the rotator cuff and this was debrided from the inside out and later from the outside in.  After debridement of synovitis Manzon was removed the scope was inserted for the posterior portal and subacromial space and a new lateral portal was established.  Patient had a large health type III acromion.  Patient underwent subacromial decompression further debridement of the massive rotator cuff tear.  The electrical wand was used for hemostasis.  The instruments removed the portals were closed using 2-0 nylon sterile dressing was applied patient was placed in a sling extubated taken the PACU in stable condition   DISCHARGE PLANNING:  Antibiotic duration: Preoperative antibiotics 3 g  Weightbearing: Not applicable  Pain medication: Prescription called in for Percocet  Dressing care/ Wound VAC: Change dressing in 2 days  Ambulatory devices: Not applicable  Discharge to: Home  Follow-up: In the office 1 week post operative.

## 2018-05-21 NOTE — Anesthesia Procedure Notes (Signed)
Anesthesia Regional Block: Interscalene brachial plexus block   Pre-Anesthetic Checklist: ,, timeout performed, Correct Patient, Correct Site, Correct Laterality, Correct Procedure, Correct Position, site marked, Risks and benefits discussed,  Surgical consent,  Pre-op evaluation,  At surgeon's request and post-op pain management  Laterality: Right  Prep: chloraprep       Needles:  Injection technique: Single-shot  Needle Type: Stimulator Needle - 40      Needle Gauge: 22     Additional Needles:   Procedures:, nerve stimulator,,,,,,,  Narrative:  Start time: 05/21/2018 7:15 AM End time: 05/21/2018 7:25 AM Injection made incrementally with aspirations every 5 mL.  Performed by: Personally   Additional Notes: 20 cc 0.25% Bupivacaine with 1:200 Epi injected easily, 10 cc 1.3% Exparel injected easily

## 2018-05-21 NOTE — Anesthesia Postprocedure Evaluation (Signed)
Anesthesia Post Note  Patient: Kenneth Rowe  Procedure(s) Performed: RIGHT SHOULDER ARTHROSCOPY, DEBRIDEMENT, AND DECOMPRESSION (Right Shoulder)     Patient location during evaluation: PACU Anesthesia Type: General and Regional Level of consciousness: awake and alert Pain management: pain level controlled Vital Signs Assessment: post-procedure vital signs reviewed and stable Respiratory status: spontaneous breathing, nonlabored ventilation, respiratory function stable and patient connected to nasal cannula oxygen Cardiovascular status: blood pressure returned to baseline and stable Postop Assessment: no apparent nausea or vomiting Anesthetic complications: no    Last Vitals:  Vitals:   05/21/18 0943 05/21/18 0945  BP:  (P) 128/63  Pulse: 73   Resp: 16   Temp:    SpO2: 95%     Last Pain:  Vitals:   05/21/18 0943  TempSrc:   PainSc: 0-No pain                 Tully Mcinturff COKER

## 2018-05-21 NOTE — Telephone Encounter (Signed)
Some effects from the block can last up to 24 hours, so they need to give it until tomorrow.

## 2018-05-21 NOTE — Transfer of Care (Signed)
Immediate Anesthesia Transfer of Care Note  Patient: Kenneth Rowe  Procedure(s) Performed: RIGHT SHOULDER ARTHROSCOPY, DEBRIDEMENT, AND DECOMPRESSION (Right Shoulder)  Patient Location: PACU  Anesthesia Type:GA combined with regional for post-op pain  Level of Consciousness: drowsy and responds to stimulation  Airway & Oxygen Therapy: Patient Spontanous Breathing and Patient connected to face mask oxygen  Post-op Assessment: Report given to RN and Post -op Vital signs reviewed and stable  Post vital signs: Reviewed and stable  Last Vitals:  Vitals Value Taken Time  BP 111/33 05/21/2018  8:59 AM  Temp 36.4 C 05/21/2018  8:58 AM  Pulse 71 05/21/2018  9:00 AM  Resp 19 05/21/2018  9:00 AM  SpO2 100 % 05/21/2018  9:00 AM  Vitals shown include unvalidated device data.  Last Pain:  Vitals:   05/21/18 0550  TempSrc:   PainSc: 0-No pain      Patients Stated Pain Goal: 4 (34/91/79 1505)  Complications: No apparent anesthesia complications

## 2018-05-21 NOTE — Anesthesia Procedure Notes (Signed)
Procedure Name: Intubation Date/Time: 05/21/2018 7:38 AM Performed by: Alain Marion, CRNA Pre-anesthesia Checklist: Patient identified, Emergency Drugs available, Suction available and Patient being monitored Patient Re-evaluated:Patient Re-evaluated prior to induction Oxygen Delivery Method: Circle System Utilized Preoxygenation: Pre-oxygenation with 100% oxygen Induction Type: IV induction Ventilation: Mask ventilation without difficulty Laryngoscope Size: Miller and 2 Grade View: Grade I Tube type: Oral Tube size: 7.5 mm Number of attempts: 1 Airway Equipment and Method: Stylet and Oral airway Placement Confirmation: ETT inserted through vocal cords under direct vision,  positive ETCO2 and breath sounds checked- equal and bilateral Secured at: 22 cm Tube secured with: Tape Dental Injury: Teeth and Oropharynx as per pre-operative assessment

## 2018-05-21 NOTE — H&P (Signed)
Kenneth Rowe is an 48 y.o. male.   Chief Complaint: Impingement syndrome right shoulder HPI: The patient is a 48 year old gentleman with persistent right shoulder pain with overhead activities and when reaching to the side.  The pain initially started when he was moving a heavy piece of furniture many months ago.  He underwent a subacromial shoulder injection earlier in the year but this failed to improve his discomfort.  He had an MRI scan of the right shoulder which showed moderate rotator cuff tendinopathy, tendinosis with interstitial tears and areas of bursa and articular surface fraying and fibrillation.  No full-thickness retracted tear was seen.  There was an anterior superior labral tear.  Intact long head of the biceps tendon and superior labrum.  Mild lateral downsloping and undersurface spurring from a type II acromion.  Mild subacromial and subdeltoid bursitis.  The patient presents for right shoulder arthroscopic debridement and decompression today.  Past Medical History:  Diagnosis Date  . GERD (gastroesophageal reflux disease)   . Hyperlipidemia   . Hypertension     Past Surgical History:  Procedure Laterality Date  . LEFT HEART CATH AND CORONARY ANGIOGRAPHY N/A 01/28/2018   Procedure: LEFT HEART CATH AND CORONARY ANGIOGRAPHY;  Surgeon: Belva Crome, MD;  Location: [facility];  Service: Cardiovascular;  Laterality: N/A;  . TONSILLECTOMY  1999    Family History  Problem Relation Age of Onset  . Hypertension Mother   . Hypertension Father   . Heart attack Father 82       first MI at 50  . Diabetes Maternal Grandmother   . Heart attack Paternal Grandfather    Social History:  reports that he has never smoked. He has never used smokeless tobacco. He reports current alcohol use. He reports that he does not use drugs.  Allergies:  Allergies  Allergen Reactions  . Lisinopril Cough    Medications Prior to Admission  Medication Sig Dispense Refill  .  amLODipine (NORVASC) 10 MG tablet Take 10 mg by mouth daily.    Marland Kitchen atorvastatin (LIPITOR) 40 MG tablet Take 40 mg by mouth daily.    Marland Kitchen loratadine (CLARITIN) 10 MG tablet Take 10 mg by mouth daily as needed for allergies.    Marland Kitchen losartan (COZAAR) 100 MG tablet Take 100 mg by mouth daily.    . Omega-3 Fatty Acids (FISH OIL) 1000 MG CAPS Take 2,000 mg by mouth daily.    Marland Kitchen omeprazole (PRILOSEC) 40 MG capsule Take 40 mg by mouth daily as needed (acid reflux).       No results found for this or any previous visit (from the past 48 hour(s)). No results found.  Review of Systems  All other systems reviewed and are negative.   Blood pressure (!) 146/93, pulse 67, temperature 97.7 F (36.5 C), temperature source Oral, resp. rate 18, height 5' 9"  (1.753 m), weight 111.1 kg, SpO2 98 %. Physical Exam  Constitutional: He is oriented to person, place, and time. He appears well-developed and well-nourished. No distress.  HENT:  Head: Normocephalic and atraumatic.  Neck: No tracheal deviation present. No thyromegaly present.  Cardiovascular: Normal rate, regular rhythm, normal heart sounds and intact distal pulses.  Respiratory: Effort normal. No respiratory distress.  GI: Soft. He exhibits no distension.  Musculoskeletal:     Comments: Right shoulder- full range of motion of the right shoulder he has pain to palpation over the biceps tendon he has pain with Neer and Hawkins impingement test pain with a  drop arm test.   Neurological: He is alert and oriented to person, place, and time. No cranial nerve deficit.  Skin: Skin is warm and dry. No rash noted. No erythema.  Psychiatric: He has a normal mood and affect. His behavior is normal. Judgment and thought content normal.     Assessment/Plan Right shoulder impingement syndrome/SLAP lesion -plan right shoulder arthroscopic debridement SLAP lesion and decompression.  The procedure and possible risks and benefits were discussed with the patient including  risk of infection, bleeding, neurovascular compromise and possible need for further surgery were discussed with the patient.  The patient's questions were answered to his satisfaction and he wishes to proceed at this time.  Erlinda Hong, PA-C 05/21/2018, 7:18 AM [place]

## 2018-05-21 NOTE — Discharge Instructions (Signed)
Rotator Cuff Tendinitis  Rotator cuff tendinitis is inflammation of the tough, cord-like bands that connect muscle to bone (tendons) in the rotator cuff. The rotator cuff includes all of the muscles and tendons that connect the arm to the shoulder. The rotator cuff holds the head of the upper arm bone (humerus) in the cup (fossa) of the shoulder blade (scapula). This condition can lead to a long-lasting (chronic) tear. The tear may be partial or complete. What are the causes? This condition is usually caused by overusing the rotator cuff. What increases the risk? This condition is more likely to develop in athletes and workers who frequently use their shoulder or reach over their heads. This can include activities such as:  Tennis.  Baseball or softball.  Swimming.  Construction work.  Painting. What are the signs or symptoms? Symptoms of this condition include:  Pain spreading (radiating) from the shoulder to the upper arm.  Swelling and tenderness in front of the shoulder.  Pain when reaching, pulling, or lifting the arm above the head.  Pain when lowering the arm from above the head.  Minor pain in the shoulder when resting.  Increased pain in the shoulder at night.  Difficulty placing the arm behind the back. How is this diagnosed? This condition is diagnosed with a medical history and physical exam. Tests may also be done, including:  X-rays.  MRI.  Ultrasounds.  CT or MR arthrogram. During this test, a contrast material is injected and then images are taken. How is this treated? Treatment for this condition depends on the severity of the condition. In less severe cases, treatment may include:  Rest. This may be done with a sling that holds the shoulder still (immobilization). Your health care provider may also recommend avoiding activities that involve lifting your arm over your head.  Icing the shoulder.  Anti-inflammatory medicines, such as aspirin or  ibuprofen. In more severe cases, treatment may include:  Physical therapy.  Steroid injections.  Surgery. Follow these instructions at home: If you have a sling:  Wear the sling as told by your health care provider. Remove it only as told by your health care provider.  Loosen the sling if your fingers tingle, become numb, or turn cold and blue.  Keep the sling clean.  If the sling is not waterproof, do not let it get wet. Remove it, if allowed, or cover it with a watertight covering when you take a bath or shower. Managing pain, stiffness, and swelling  If directed, put ice on the injured area. ? If you have a removable sling, remove it as told by your health care provider. ? Put ice in a plastic bag. ? Place a towel between your skin and the bag. ? Leave the ice on for 20 minutes, 2-3 times a day.  Move your fingers often to avoid stiffness and to lessen swelling.  Raise (elevate) the injured area above the level of your heart while you are lying down.  Find a comfortable sleeping position or sleep on a recliner, if available. Driving  Do not drive or use heavy machinery while taking prescription pain medicine.  Ask your health care provider when it is safe to drive if you have a sling on your arm. Activity  Rest your shoulder as told by your health care provider.  Return to your normal activities as told by your health care provider. Ask your health care provider what activities are safe for you.  Do any exercises or stretches as  told by your health care provider.  If you do repetitive overhead tasks, take small breaks in between and include stretching exercises as told by your health care provider. General instructions  Do not use any products that contain nicotine or tobacco, such as cigarettes and e-cigarettes. These can delay healing. If you need help quitting, ask your health care provider.  Take over-the-counter and prescription medicines only as told by your  health care provider.  Keep all follow-up visits as told by your health care provider. This is important. Contact a health care provider if:  Your pain gets worse.  You have new pain in your arm, hands, or fingers.  Your pain is not relieved with medicine or does not get better after 6 weeks of treatment.  You have cracking sensations when moving your shoulder in certain directions.  You hear a snapping sound after using your shoulder, followed by severe pain and weakness. Get help right away if:  Your arm, hand, or fingers are numb or tingling.  Your arm, hand, or fingers are swollen or painful or they turn white or blue. Summary  Rotator cuff tendinitis is inflammation of the tough, cord-like bands that connect muscle to bone (tendons) in the rotator cuff.  This condition is usually caused by overusing the rotator cuff, which includes all of the muscles and tendons that connect the arm to the shoulder.  This condition is more likely to develop in athletes and workers who frequently use their shoulder or reach over their heads.  Treatment generally includes rest, anti-inflammatory medicines, and icing. In some cases, physical therapy and steroid injections may be needed. In severe cases, surgery may be needed. This information is not intended to replace advice given to you by your health care provider. Make sure you discuss any questions you have with your health care provider. Document Released: 06/07/2003 Document Revised: 03/03/2016 Document Reviewed: 03/03/2016 Elsevier Interactive Patient Education  2019 [employer].   Secondary Shoulder Impingement Syndrome Rehab Ask your health care provider which exercises are safe for you. Do exercises exactly as told by your health care provider and adjust them as directed. It is normal to feel mild stretching, pulling, tightness, or discomfort as you do these exercises, but you should stop right away if you feel sudden pain or your  pain gets worse. Do not begin these exercises until told by your health care provider. Stretching and range of motion exercise This exercise warms up your muscles and joints and improves the movement and flexibility of your neck and shoulder. This exercise also helps to relieve pain and stiffness. Exercise A: Cervical side bend  1. Using good posture, sit on a stable chair, or stand up. 2. Without moving your shoulders, slowly tilt your left / right ear toward your left / right shoulder until you feel a stretch in your neck muscles. You should be looking straight ahead. 3. Hold for __________ seconds. 4. Slowly return to the starting position. 5. Repeat on your left / right side. Repeat __________ times. Complete this exercise __________ times a day. Strengthening exercises These exercises build strength and endurance in your shoulder. Endurance is the ability to use your muscles for a long time, even after they get tired. Exercise B: Scapular protraction, supine  1. Lie on your back on a firm surface. Hold a __________ weight in your left / right hand. 2. Raise your left / right arm straight into the air so your hand is directly above your shoulder joint. 3.  Push the weight into the air so your shoulder lifts off of the surface that you are lying on. Do not move your head, neck, or back. 4. Hold for __________ seconds. 5. Slowly return to the starting position. Let your muscles relax completely before you repeat this exercise. Repeat __________ times. Complete this exercise __________ times a day. Exercise C: Scapular retraction  1. Sit in a stable chair without armrests, or stand. 2. Secure an exercise band to a stable object in front of you so the band is at shoulder height. 3. Hold one end of the exercise band in each hand. Your palms should face down. 4. Squeeze your shoulder blades together and move your elbows slightly behind you. Do not shrug your shoulders while you do  this. 5. Hold for __________ seconds. 6. Slowly return to the starting position. Repeat __________ times. Complete this exercise __________ times a day. Exercise D: Shoulder extension with scapular retraction  1. Sit in a stable chair without armrests, or stand. 2. Secure an exercise band to a stable object in front of you where it is above shoulder height. 3. Hold one end of the exercise band in each hand. 4. Straighten your elbows and lift your hands up to shoulder height. 5. Squeeze your shoulder blades together and pull your hands down to the sides of your thighs. Stop when your hands are straight down by your sides. Do not let your hands go behind your body. 6. Hold for __________ seconds. 7. Slowly return to the starting position. Repeat __________ times. Complete this exercise __________ times a day. Exercise E: Shoulder abduction 1. Sit in a stable chair without armrests, or stand. 2. If directed, hold a __________ weight in your left / right hand. 3. Start with your arms straight down. Turn your left / right hand so your palm faces in, toward your body. 4. Slowly lift your left / right hand out to your side. Do not lift your hand above shoulder height. ? Keep your arms straight. ? Avoid shrugging your shoulder while you do this movement. Keep your shoulder blade tucked down toward the middle of your back. 5. Hold for __________ seconds. 6. Slowly lower your arm, and return to the starting position. Repeat __________ times. Complete this exercise __________ times a day. This information is not intended to replace advice given to you by your health care provider. Make sure you discuss any questions you have with your health care provider. Document Released: 03/17/2005 Document Revised: 11/22/2015 Document Reviewed: 02/17/2015 Elsevier Interactive Patient Education  2019 [employer]   Shoulder Impingement Syndrome  Shoulder impingement syndrome is a condition that causes pain  when connective tissues (tendons) surrounding the shoulder joint become pinched. These tendons are part of the group of muscles and tissues that help to stabilize the shoulder (rotator cuff). Beneath the rotator cuff is a fluid-filled sac (bursa) that allows the muscles and tendons to glide smoothly. The bursa may become swollen or irritated (bursitis). Bursitis, swelling in the rotator cuff tendons, or both conditions can decrease how much space is under a bone in the shoulder joint (acromion), resulting in impingement. What are the causes? Shoulder impingement syndrome may be caused by bursitis or swelling of the rotator cuff tendons, which may result from:  Repetitive overhead arm movements.  Falling onto the shoulder.  Weakness in the shoulder muscles. What increases the risk? You may be more likely to develop this condition if you:  Play sports that involve throwing, such as  baseball.  Participate in sports such as tennis, volleyball, and swimming.  Work as a Curator, Games developer, or Architect. Some people are also more likely to develop impingement syndrome because of the shape of their acromion bone. What are the signs or symptoms? The main symptom of this condition is pain on the front or side of the shoulder. The pain may:  Get worse when lifting or raising the arm.  Get worse at night.  Wake you up from sleeping.  Feel sharp when the shoulder is moved and then fade to an ache. Other symptoms may include:  Tenderness.  Stiffness.  Inability to raise the arm above shoulder level or behind the body.  Weakness. How is this diagnosed? This condition may be diagnosed based on:  Your symptoms and medical history.  A physical exam.  Imaging tests, such as: ? X-rays. ? MRI. ? Ultrasound. How is this treated? This condition may be treated by:  Resting your shoulder and avoiding all activities that cause pain or put stress on the shoulder.  Icing your  shoulder.  NSAIDs to help reduce pain and swelling.  One or more injections of medicines to numb the area and reduce inflammation.  Physical therapy.  Surgery. This may be needed if nonsurgical treatments have not helped. Surgery may involve repairing the rotator cuff, reshaping the acromion, or removing the bursa. Follow these instructions at home: Managing pain, stiffness, and swelling   If directed, put ice on the injured area. ? Put ice in a plastic bag. ? Place a towel between your skin and the bag. ? Leave the ice on for 20 minutes, 2-3 times a day. Activity  Rest and return to your normal activities as told by your health care provider. Ask your health care provider what activities are safe for you.  Do exercises as told by your health care provider. General instructions  Do not use any products that contain nicotine or tobacco, such as cigarettes, e-cigarettes, and chewing tobacco. These can delay healing. If you need help quitting, ask your health care provider.  Ask your health care provider when it is safe for you to drive.  Take over-the-counter and prescription medicines only as told by your health care provider.  Keep all follow-up visits as told by your health care provider. This is important. How is this prevented?  Give your body time to rest between periods of activity.  Be safe and responsible while being active. This will help you avoid falls.  Maintain physical fitness, including strength and flexibility. Contact a health care provider if:  Your symptoms have not improved after 1-2 months of treatment and rest.  You cannot lift your arm away from your body. Summary  Shoulder impingement syndrome is a condition that causes pain when connective tissues (tendons) surrounding the shoulder joint become pinched.  The main symptom of this condition is pain on the front or side of the shoulder.  This condition is usually treated with rest, ice, and pain  medicines as needed. This information is not intended to replace advice given to you by your health care provider. Make sure you discuss any questions you have with your health care provider. Document Released: 03/17/2005 Document Revised: 09/09/2017 Document Reviewed: 09/09/2017 Elsevier Interactive Patient Education  2019 [place].   Secondary Shoulder Impingement Syndrome  Shoulder impingement syndrome is a condition that causes pain when connective tissues (tendons) surrounding the shoulder joint become pinched. These tendons are part of the group of muscles and tissues  that help to stabilize the shoulder (rotator cuff). There are two types of impingement syndrome: primary and secondary. Secondary impingement syndrome occurs when movement of the shoulder joint is abnormal. This can happen if there is too much movement (laxity), too little movement (stiffness), or abnormal movement. What are the causes? This condition may be caused by:  Shoulder blade muscles that are weak or uncoordinated (scapular dyskinesis).  Glenohumeral instability. This is too much movement of the upper arm bone (humerus). This can result from: ? Having loose joints. ? An injury that happened during repeated overhead arm movements, such as throwing.  A hard, direct hit (blow) to the shoulder. This is rare. What increases the risk? You may be more likely to develop this condition if you have injured your shoulder in the past or if you are an athlete who participates in:  Sports that involve throwing, such as baseball.  Tennis.  Swimming.  Volleyball. What are the signs or symptoms? The main symptom of this condition is pain on the front or side of the shoulder. Pain may:  Get worse when lifting or raising the arm.  Get worse at night.  Wake you up from sleeping.  Feel sharp when the shoulder is moved, and then fade to an ache. Other signs and symptoms may  include:  Tenderness.  Stiffness.  Inability to raise the arm above shoulder level or behind the body.  Weakness. How is this diagnosed? This condition may be diagnosed based on:  Your symptoms.  Your medical history.  A physical exam.  Imaging tests, such as: ? X-rays. ? MRI. ? Ultrasound. How is this treated? Treatment for this condition may include:  Resting your shoulder and avoiding all activities that cause pain or put stress on the shoulder.  Icing your shoulder.  NSAIDs to help reduce pain and swelling.  One or more injections of medicines to numb the area and reduce inflammation.  Physical therapy.  Surgery. This may be needed if nonsurgical treatments do not help. Surgery may involve stabilizing your shoulder and repairing your rotator cuff, as needed. Follow these instructions at home: Managing pain, stiffness, and swelling   If directed, put ice on the injured area. ? Put ice in a plastic bag. ? Place a towel between your skin and the bag. ? Leave the ice on for 20 minutes, 2-3 times a day. Activity  Rest and return to your normal activities as told by your health care provider. Ask your health care provider what activities are safe for you.  Do exercises as told by your health care provider. General instructions  Do not use any tobacco products, including cigarettes, chewing tobacco, or e-cigarettes. Tobacco can delay healing. If you need help quitting, ask your health care provider.  Ask your health care provider when it is safe for you to drive.  Take over-the-counter and prescription medicines only as told by your health care provider.  Keep all follow-up visits as told by your health care provider. This is important. How is this prevented?  Give your body time to rest between periods of activity.  Maintain physical fitness, including strength in your shoulder muscles and back muscles. Contact a health care provider if:  Your symptoms  have not improved after 2-3 months of treatment.  Your symptoms are getting worse. This information is not intended to replace advice given to you by your health care provider. Make sure you discuss any questions you have with your health care provider. Document Released: 03/17/2005  Document Revised: 11/22/2015 Document Reviewed: 02/17/2015 Elsevier Interactive Patient Education  2019 [employer]   Shoulder Exercises Ask your health care provider which exercises are safe for you. Do exercises exactly as told by your health care provider and adjust them as directed. It is normal to feel mild stretching, pulling, tightness, or discomfort as you do these exercises, but you should stop right away if you feel sudden pain or your pain gets worse.Do not begin these exercises until told by your health care provider. Range of Motion Exercises        These exercises warm up your muscles and joints and improve the movement and flexibility of your shoulder. These exercises also help to relieve pain, numbness, and tingling. These exercises involve stretching your injured shoulder directly. Exercise A: Pendulum 1. Stand near a wall or a surface that you can hold onto for balance. 2. Bend at the waist and let your left / right arm hang straight down. Use your other arm to support you. Keep your back straight and do not lock your knees. 3. Relax your left / right arm and shoulder muscles, and move your hips and your trunk so your left / right arm swings freely. Your arm should swing because of the motion of your body, not because you are using your arm or shoulder muscles. 4. Keep moving your body so your arm swings in the following directions, as told by your health care provider: ? Side to side. ? Forward and backward. ? In clockwise and counterclockwise circles. 5. Continue each motion for __________ seconds, or for as long as told by your health care provider. 6. Slowly return to the starting  position. Repeat __________ times. Complete this exercise __________ times a day. Exercise B:Flexion, Standing 1. Stand and hold a broomstick, a cane, or a similar object. Place your hands a little more than shoulder-width apart on the object. Your left / right hand should be palm-up, and your other hand should be palm-down. 2. Keep your elbow straight and keep your shoulder muscles relaxed. Push the stick down with your healthy arm to raise your left / right arm in front of your body, and then over your head until you feel a stretch in your shoulder. ? Avoid shrugging your shoulder while you raise your arm. Keep your shoulder blade tucked down toward the middle of your back. 3. Hold for __________ seconds. 4. Slowly return to the starting position. Repeat __________ times. Complete this exercise __________ times a day. Exercise C: Abduction, Standing 1. Stand and hold a broomstick, a cane, or a similar object. Place your hands a little more than shoulder-width apart on the object. Your left / right hand should be palm-up, and your other hand should be palm-down. 2. While keeping your elbow straight and your shoulder muscles relaxed, push the stick across your body toward your left / right side. Raise your left / right arm to the side of your body and then over your head until you feel a stretch in your shoulder. ? Do not raise your arm above shoulder height, unless your health care provider tells you to do that. ? Avoid shrugging your shoulder while you raise your arm. Keep your shoulder blade tucked down toward the middle of your back. 3. Hold for __________ seconds. 4. Slowly return to the starting position. Repeat __________ times. Complete this exercise __________ times a day. Exercise D:Internal Rotation 1. Place your left / right hand behind your back, palm-up. 2. Use your  other hand to dangle an exercise band, a towel, or a similar object over your shoulder. Grasp the band with your left  / right hand so you are holding onto both ends. 3. Gently pull up on the band until you feel a stretch in the front of your left / right shoulder. ? Avoid shrugging your shoulder while you raise your arm. Keep your shoulder blade tucked down toward the middle of your back. 4. Hold for __________ seconds. 5. Release the stretch by letting go of the band and lowering your hands. Repeat __________ times. Complete this exercise __________ times a day. Stretching Exercises  These exercises warm up your muscles and joints and improve the movement and flexibility of your shoulder. These exercises also help to relieve pain, numbness, and tingling. These exercises are done using your healthy shoulder to help stretch the muscles of your injured shoulder. Exercise E: Warehouse manager (External Rotation and Abduction) 1. Stand in a doorway with one of your feet slightly in front of the other. This is called a staggered stance. If you cannot reach your forearms to the door frame, stand facing a corner of a room. 2. Choose one of the following positions as told by your health care provider: ? Place your hands and forearms on the door frame above your head. ? Place your hands and forearms on the door frame at the height of your head. ? Place your hands on the door frame at the height of your elbows. 3. Slowly move your weight onto your front foot until you feel a stretch across your chest and in the front of your shoulders. Keep your head and chest upright and keep your abdominal muscles tight. 4. Hold for __________ seconds. 5. To release the stretch, shift your weight to your back foot. Repeat __________ times. Complete this stretch __________ times a day. Exercise F:Extension, Standing 1. Stand and hold a broomstick, a cane, or a similar object behind your back. ? Your hands should be a little wider than shoulder-width apart. ? Your palms should face away from your back. 2. Keeping your elbows straight and  keeping your shoulder muscles relaxed, move the stick away from your body until you feel a stretch in your shoulder. ? Avoid shrugging your shoulders while you move the stick. Keep your shoulder blade tucked down toward the middle of your back. 3. Hold for __________ seconds. 4. Slowly return to the starting position. Repeat __________ times. Complete this exercise __________ times a day. Strengthening Exercises           These exercises build strength and endurance in your shoulder. Endurance is the ability to use your muscles for a long time, even after they get tired. Exercise G:External Rotation 1. Sit in a stable chair without armrests. 2. Secure an exercise band at elbow height on your left / right side. 3. Place a soft object, such as a folded towel or a small pillow, between your left / right upper arm and your body to move your elbow a few inches away (about 10 cm) from your side. 4. Hold the end of the band so it is tight and there is no slack. 5. Keeping your elbow pressed against the soft object, move your left / right forearm out, away from your abdomen. Keep your body steady so only your forearm moves. 6. Hold for __________ seconds. 7. Slowly return to the starting position. Repeat __________ times. Complete this exercise __________ times a day. Exercise H:Shoulder Abduction 1.  Sit in a stable chair without armrests, or stand. 2. Hold a __________ weight in your left / right hand, or hold an exercise band with both hands. 3. Start with your arms straight down and your left / right palm facing in, toward your body. 4. Slowly lift your left / right hand out to your side. Do not lift your hand above shoulder height unless your health care provider tells you that this is safe. ? Keep your arms straight. ? Avoid shrugging your shoulder while you do this movement. Keep your shoulder blade tucked down toward the middle of your back. 5. Hold for __________ seconds. 6. Slowly  lower your arm, and return to the starting position. Repeat __________ times. Complete this exercise __________ times a day. Exercise I:Shoulder Extension 1. Sit in a stable chair without armrests, or stand. 2. Secure an exercise band to a stable object in front of you where it is at shoulder height. 3. Hold one end of the exercise band in each hand. Your palms should face each other. 4. Straighten your elbows and lift your hands up to shoulder height. 5. Step back, away from the secured end of the exercise band, until the band is tight and there is no slack. 6. Squeeze your shoulder blades together as you pull your hands down to the sides of your thighs. Stop when your hands are straight down by your sides. Do not let your hands go behind your body. 7. Hold for __________ seconds. 8. Slowly return to the starting position. Repeat __________ times. Complete this exercise __________ times a day. Exercise J:Standing Shoulder Row 1. Sit in a stable chair without armrests, or stand. 2. Secure an exercise band to a stable object in front of you so it is at waist height. 3. Hold one end of the exercise band in each hand. Your palms should be in a thumbs-up position. 4. Bend each of your elbows to an "L" shape (about 90 degrees) and keep your upper arms at your sides. 5. Step back until the band is tight and there is no slack. 6. Slowly pull your elbows back behind you. 7. Hold for __________ seconds. 8. Slowly return to the starting position. Repeat __________ times. Complete this exercise __________ times a day. Exercise K:Shoulder Press-Ups 1. Sit in a stable chair that has armrests. Sit upright, with your feet flat on the floor. 2. Put your hands on the armrests so your elbows are bent and your fingers are pointing forward. Your hands should be about even with the sides of your body. 3. Push down on the armrests and use your arms to lift yourself off of the chair. Straighten your elbows and  lift yourself up as much as you comfortably can. ? Move your shoulder blades down, and avoid letting your shoulders move up toward your ears. ? Keep your feet on the ground. As you get stronger, your feet should support less of your body weight as you lift yourself up. 4. Hold for __________ seconds. 5. Slowly lower yourself back into the chair. Repeat __________ times. Complete this exercise __________ times a day. Exercise L: Wall Push-Ups 1. Stand so you are facing a stable wall. Your feet should be about one arm-length away from the wall. 2. Lean forward and place your palms on the wall at shoulder height. 3. Keep your feet flat on the floor as you bend your elbows and lean forward toward the wall. 4. Hold for __________ seconds. 5. Straighten your elbows  to push yourself back to the starting position. Repeat __________ times. Complete this exercise __________ times a day. This information is not intended to replace advice given to you by your health care provider. Make sure you discuss any questions you have with your health care provider. Document Released: 01/29/2005 Document Revised: 07/21/2017 Document Reviewed: 11/26/2014 Elsevier Interactive Patient Education  2019 [employer] [place].   Shoulder Impingement Syndrome Rehab Ask your health care provider which exercises are safe for you. Do exercises exactly as told by your health care provider and adjust them as directed. It is normal to feel mild stretching, pulling, tightness, or discomfort as you do these exercises, but you should stop right away if you feel sudden pain or your pain gets worse.Do not begin these exercises until told by your health care provider. Stretching and range of motion exercise This exercise warms up your muscles and joints and improves the movement and flexibility of your shoulder. This exercise also helps to relieve pain and stiffness. Exercise A: Passive horizontal adduction  1. Sit or stand and pull your  left / right elbow across your chest, toward your other shoulder. Stop when you feel a gentle stretch in the back of your shoulder and upper arm. ? Keep your arm at shoulder height. ? Keep your arm as close to your body as you comfortably can. 2. Hold for __________ seconds. 3. Slowly return to the starting position. Repeat __________ times. Complete this exercise __________ times a day. Strengthening exercises These exercises build strength and endurance in your shoulder. Endurance is the ability to use your muscles for a long time, even after they get tired. Exercise B: External rotation, isometric 1. Stand or sit in a doorway, facing the door frame. 2. Bend your left / right elbow and place the back of your wrist against the door frame. Only your wrist should be touching the frame. Keep your upper arm at your side. 3. Gently press your wrist against the door frame, as if you are trying to push your arm away from your abdomen. ? Avoid shrugging your shoulder while you press your hand against the door frame. Keep your shoulder blade tucked down toward the middle of your back. 4. Hold for __________ seconds. 5. Slowly release the tension, and relax your muscles completely before you do the exercise again. Repeat __________ times. Complete this exercise __________ times a day. Exercise C: Internal rotation, isometric  1. Stand or sit in a doorway, facing the door frame. 2. Bend your left / right elbow and place the inside of your wrist against the door frame. Only your wrist should be touching the frame. Keep your upper arm at your side. 3. Gently press your wrist against the door frame, as if you are trying to push your arm toward your abdomen. ? Avoid shrugging your shoulder while you press your hand against the door frame. Keep your shoulder blade tucked down toward the middle of your back. 4. Hold for __________ seconds. 5. Slowly release the tension, and relax your muscles completely  before you do the exercise again. Repeat __________ times. Complete this exercise __________ times a day. Exercise D: Scapular protraction, supine  8. Lie on your back on a firm surface. Hold a __________ weight in your left / right hand. 9. Raise your left / right arm straight into the air so your hand is directly above your shoulder joint. 10. Push the weight into the air so your shoulder lifts off of the surface that  you are lying on. Do not move your head, neck, or back. 11. Hold for __________ seconds. 12. Slowly return to the starting position. Let your muscles relax completely before you repeat this exercise. Repeat __________ times. Complete this exercise __________ times a day. Exercise E: Scapular retraction  1. Sit in a stable chair without armrests, or stand. 2. Secure an exercise band to a stable object in front of you so the band is at shoulder height. 3. Hold one end of the exercise band in each hand. Your palms should face down. 4. Squeeze your shoulder blades together and move your elbows slightly behind you. Do not shrug your shoulders while you do this. 5. Hold for __________ seconds. 6. Slowly return to the starting position. Repeat __________ times. Complete this exercise __________ times a day. Exercise F: Shoulder extension  1. Sit in a stable chair without armrests, or stand. 2. Secure an exercise band to a stable object in front of you where the band is above shoulder height. 3. Hold one end of the exercise band in each hand. 4. Straighten your elbows and lift your hands up to shoulder height. 5. Squeeze your shoulder blades together and pull your hands down to the sides of your thighs. Stop when your hands are straight down by your sides. Do not let your hands go behind your body. 6. Hold for __________ seconds. 7. Slowly return to the starting position. Repeat __________ times. Complete this exercise __________ times a day. This information is not intended to  replace advice given to you by your health care provider. Make sure you discuss any questions you have with your health care provider. Document Released: 03/17/2005 Document Revised: 11/22/2015 Document Reviewed: 02/17/2015 Elsevier Interactive Patient Education  2019 [employer].

## 2018-05-22 ENCOUNTER — Encounter (HOSPITAL_COMMUNITY): Payer: Self-pay | Admitting: Orthopedic Surgery

## 2018-05-24 ENCOUNTER — Other Ambulatory Visit (INDEPENDENT_AMBULATORY_CARE_PROVIDER_SITE_OTHER): Payer: Self-pay | Admitting: Physician Assistant

## 2018-05-24 ENCOUNTER — Telehealth (INDEPENDENT_AMBULATORY_CARE_PROVIDER_SITE_OTHER): Payer: Self-pay

## 2018-05-24 DIAGNOSIS — M7541 Impingement syndrome of right shoulder: Secondary | ICD-10-CM

## 2018-05-24 MED ORDER — TRAMADOL HCL 50 MG PO TABS: 50.0000 mg | ORAL_TABLET | Freq: Four times a day (QID) | ORAL | 0 refills | Status: AC | PRN

## 2018-05-24 NOTE — Telephone Encounter (Signed)
Will send Rx for tramadol.

## 2018-05-24 NOTE — Telephone Encounter (Signed)
Shawn , please advise. Rx was filled on 05/20/2018. Thank you

## 2018-05-24 NOTE — Telephone Encounter (Signed)
Patient called stating that he stopped taking the Rx for Oxycodone due to feeling like he was having shortness of breath.  Would like another Rx for pain sent to his pharmacy? Cb# is [phone].  Please advise.  Thank You.

## 2018-05-25 ENCOUNTER — Telehealth (INDEPENDENT_AMBULATORY_CARE_PROVIDER_SITE_OTHER): Payer: Self-pay

## 2018-05-25 NOTE — Telephone Encounter (Signed)
Patient was called and lvm to return call today if he is continuing having problems and I inform him that his Rx was sent over for Tramadol yesterday.

## 2018-05-28 ENCOUNTER — Ambulatory Visit (INDEPENDENT_AMBULATORY_CARE_PROVIDER_SITE_OTHER): Payer: 59 | Admitting: Physician Assistant

## 2018-05-28 ENCOUNTER — Encounter (INDEPENDENT_AMBULATORY_CARE_PROVIDER_SITE_OTHER): Payer: Self-pay | Admitting: Physician Assistant

## 2018-05-28 VITALS — Ht 69.0 in | Wt 245.0 lb

## 2018-05-28 DIAGNOSIS — S46011D Strain of muscle(s) and tendon(s) of the rotator cuff of right shoulder, subsequent encounter: Secondary | ICD-10-CM

## 2018-05-28 NOTE — Progress Notes (Signed)
Office Visit Note   Patient: Kenneth Rowe           Date of Birth: 04-15-1970           MRN: [id number] Visit Date: 05/28/2018              Requested by: Caryl Bis, MD [place], [place] [id number] PCP: Caryl Bis, MD  Chief Complaint  Patient presents with  . Left Shoulder - Routine Post Op    05/21/2018 right shoulder artho, deb & decompression      HPI: The patient is a 48 year old gentleman who is seen for postoperative follow-up following a right shoulder arthroscopic debridement and decompression on 05/21/2018.  The patient was noted to have a massive retracted rotator cuff tear with tear of the subscapularis, osteochondral defects of the glenoid and humeral head and a large hook type III acromion. He reports that he initially took some hydrocodone postoperatively but felt like he was having an asthma attack with labored breathing and tried some tramadol instead but now is only taking ibuprofen and not really requiring much of any of that.  He did notice a fine rash on his right upper extremity following surgery.  The rash was isolated to the right upper extremity and has improved since surgery and it is not bothering him at all.  He has been doing some gentle pendulum type shoulder exercises and also doing some finger wall walking but would like to participate in some physical therapy for his shoulder as well.    Assessment & Plan: Visit Diagnoses:  1. Traumatic tear of right rotator cuff, unspecified tear extent, subsequent encounter     Plan: Sutures removed today. Will refer for occupational therapy at [facility]. Follow up in 4 weeks.   Follow-Up Instructions: Return in about 4 weeks (around 06/25/2018).   Ortho Exam  Patient is alert, oriented, no adenopathy, well-dressed, normal affect, normal respiratory effort. The right shoulder port sites are healing well and sutures were removed without difficulty.  There is no signs of  cellulitis or infection.  He is neurovascularly intact distally.  He does have a fine rash over the right upper extremity which is slightly raised.  He reports that it is not bothering him at all and has actually improved significantly.  Imaging: No results found. No images are attached to the encounter.  Labs: No results found for: HGBA1C, ESRSEDRATE, CRP, LABURIC, REPTSTATUS, GRAMSTAIN, CULT, LABORGA   No results found for: ALBUMIN, PREALBUMIN, LABURIC  Body mass index is 36.18 kg/m.  Orders:  Orders Placed This Encounter  Procedures  . Ambulatory referral to Occupational Therapy   No orders of the defined types were placed in this encounter.    Procedures: No procedures performed  Clinical Data: No additional findings.  ROS:  All other systems negative, except as noted in the HPI. Review of Systems  Objective: Vital Signs: Ht 5' 9"  (1.753 m)   Wt 245 lb (111.1 kg)   BMI 36.18 kg/m   Specialty Comments:  No specialty comments available.  PMFS History: Patient Active Problem List   Diagnosis Date Noted  . Nontraumatic complete tear of right rotator cuff   . Impingement syndrome of right shoulder   . Chest pain 01/28/2018   Past Medical History:  Diagnosis Date  . GERD (gastroesophageal reflux disease)   . Hyperlipidemia   . Hypertension     Family History  Problem Relation Age of Onset  .  Hypertension Mother   . Hypertension Father   . Heart attack Father 67       first MI at 26  . Diabetes Maternal Grandmother   . Heart attack Paternal Grandfather     Past Surgical History:  Procedure Laterality Date  . LEFT HEART CATH AND CORONARY ANGIOGRAPHY N/A 01/28/2018   Procedure: LEFT HEART CATH AND CORONARY ANGIOGRAPHY;  Surgeon: Belva Crome, MD;  Location: [facility];  Service: Cardiovascular;  Laterality: N/A;  . SHOULDER ARTHROSCOPY Right 05/21/2018   Procedure: RIGHT SHOULDER ARTHROSCOPY, DEBRIDEMENT, AND DECOMPRESSION;  Surgeon: Newt Minion, MD;  Location: [facility];  Service: Orthopedics;  Laterality: Right;  . TONSILLECTOMY  1999   Social History   Occupational History    CommentGarment/textile technologist  Tobacco Use  . Smoking status: Never Smoker  . Smokeless tobacco: Never Used  Substance and Sexual Activity  . Alcohol use: Yes    Alcohol/week: 0.0 standard drinks    Comment: Very Rare  . Drug use: Never  . Sexual activity: Not on file

## 2018-06-04 ENCOUNTER — Other Ambulatory Visit: Payer: Self-pay

## 2018-06-04 ENCOUNTER — Encounter (HOSPITAL_COMMUNITY): Payer: Self-pay | Admitting: Specialist

## 2018-06-04 ENCOUNTER — Ambulatory Visit (HOSPITAL_COMMUNITY): Payer: 59 | Attending: Physician Assistant | Admitting: Specialist

## 2018-06-04 DIAGNOSIS — M25611 Stiffness of right shoulder, not elsewhere classified: Secondary | ICD-10-CM | POA: Diagnosis present

## 2018-06-04 DIAGNOSIS — M25511 Pain in right shoulder: Secondary | ICD-10-CM | POA: Diagnosis not present

## 2018-06-04 DIAGNOSIS — R29898 Other symptoms and signs involving the musculoskeletal system: Secondary | ICD-10-CM | POA: Diagnosis not present

## 2018-06-04 NOTE — Patient Instructions (Signed)
  COMPLETE EACH EXERCISE FOR 1-3 MINUTES 3-4 TIMES PER DAY.   SHOULDER: Flexion On Table   Place hands on towel placed on table, elbows straight. Lean forward with you upper body, pushing towel away from body. Abduction (Passive)   With arm out to side, resting on towel placed on table, palm down.  Slide arm across table -  lean to the side while pushing towel away from body.Copyright  VHI. All rights reserved.     Internal Rotation (Assistive)   Seated with elbow bent at right angle and held against side, slide arm on table surface in an inward arc keeping elbow anchored in place. Repeat ____ times. Do ____ sessions per day. Activity: Use this motion to brush crumbs off the table.  Copyright  VHI. All rights reserved.   QUESTIONSHazeline Rowe, OTR/L  [phone]

## 2018-06-04 NOTE — Therapy (Signed)
[place], [place], [address] Phone: [phone]   Fax:  [phone]  Occupational Therapy Evaluation  Patient Details  Name: Kenneth Rowe MRN: [id number] Date of Birth: July 08, 1970 Referring Provider (OT): Milas Gain, [place]    Encounter Date: 06/04/2018  OT End of Session - 06/04/18 1123    Visit Number  1    Number of Visits  12    Date for OT Re-Evaluation  07/16/18   mini reassess on 3/27   Authorization Type  [employer]:  20 visits per calendar year, hard stop     Authorization - Visit Number  1    Authorization - Number of Visits  20    OT Start Time  [phone]    OT Stop Time  0945    OT Time Calculation (min)  40 min    Activity Tolerance  Patient tolerated treatment well    Behavior During Therapy  [facility] for tasks assessed/performed       Past Medical History:  Diagnosis Date  . GERD (gastroesophageal reflux disease)   . Hyperlipidemia   . Hypertension     Past Surgical History:  Procedure Laterality Date  . LEFT HEART CATH AND CORONARY ANGIOGRAPHY N/A 01/28/2018   Procedure: LEFT HEART CATH AND CORONARY ANGIOGRAPHY;  Surgeon: Belva Crome, MD;  Location: [place] [facility];  Service: Cardiovascular;  Laterality: N/A;  . SHOULDER ARTHROSCOPY Right 05/21/2018   Procedure: RIGHT SHOULDER ARTHROSCOPY, DEBRIDEMENT, AND DECOMPRESSION;  Surgeon: Newt Minion, MD;  Location: [place];  Service: Orthopedics;  Laterality: Right;  . TONSILLECTOMY  1999    There were no vitals filed for this visit.  Subjective Assessment - 06/04/18 1112    Subjective   S:  I want to get full use of my arm back.    Pertinent History  [name] reports beginning to experience pain in his right shoulder after moving a dresser, approximately one year ago.  He consulted with MD in April, was referred to orthopedic surgeron.  He recieved a cortisone injection, which did not help with pain or mobility.  On 05/21/18 patient  had a right scope for DCE/SAD.  Patient has been out of sling for 1.5 weeks.  He has been referred to occupational therapy for evaluation and treatment.      Special Tests  FOTO 45% independent    Patient Stated Goals  I want to get back to normal    Currently in Pain?  Yes    Pain Score  7     Pain Location  Shoulder    Pain Orientation  Right;Anterior    Pain Descriptors / Indicators  Aching;Tender    Pain Type  Surgical pain;Acute pain    Pain Radiating Towards  upper arm    Pain Onset  1 to 4 weeks ago    Pain Frequency  Intermittent    Aggravating Factors   movement or functional use    Pain Relieving Factors  rest and ice     Effect of Pain on Daily Activities  moderate         [facility] OT Assessment - 06/04/18 0001      Assessment   Medical Diagnosis  S/P Right Arthroscopic SAD/DCE    Referring Provider (OT)  Shawn Annamarie Dawley, PA     Onset Date/Surgical Date  05/21/18    Hand Dominance  Right    Prior Therapy  n/a  Precautions   Precautions  Shoulder    Type of Shoulder Precautions  progress as tolerated      Restrictions   Weight Bearing Restrictions  No      Balance Screen   Has the patient fallen in the past 6 months  No    Has the patient had a decrease in activity level because of a fear of falling?   No    Is the patient reluctant to leave their home because of a fear of falling?   No      Home  Environment   Family/patient expects to be discharged to:  Private residence    Lives With  Kenneth Rowe  Full time employment    Manufacturing engineer - mostly desk work    Leisure  jet skiing, roller skating, working in yard, outdoor activities       ADL   ADL comments  unable to use his dominant right arm for functional activities above shoulder height, unable to reach behind back, unable to lift anything heavy       Written Expression   Dominant Hand  Right       Vision - History   Baseline Vision  No visual deficits      Cognition   Overall Cognitive Status  Within Functional Limits for tasks assessed      Observation/Other Assessments   Focus on Therapeutic Outcomes (FOTO)   45% independent      Sensation   Light Touch  Appears Intact      Coordination   Gross Motor Movements are Fluid and Coordinated  Yes    Fine Motor Movements are Fluid and Coordinated  Yes      ROM / Strength   AROM / PROM / Strength  AROM;PROM;Strength      Palpation   Palpation comment  moderate fascial restrictions in right upper arm, scapular, and shoulder  region      AROM   Overall AROM Comments  assessed in standing, external and internal rotation with shoulder adducted    AROM Assessment Site  Shoulder    Right/Left Shoulder  Right    Right Shoulder Flexion  90 Degrees    Right Shoulder ABduction  90 Degrees    Right Shoulder Internal Rotation  80 Degrees    Right Shoulder External Rotation  50 Degrees      PROM   Overall PROM Comments  assessed in supine, external and internal rotation with shoulder adducted    PROM Assessment Site  Shoulder    Right/Left Shoulder  Right    Right Shoulder Flexion  140 Degrees    Right Shoulder ABduction  145 Degrees    Right Shoulder Internal Rotation  80 Degrees    Right Shoulder External Rotation  40 Degrees      Strength   Overall Strength Comments  not assessed due to recent surgery    Strength Assessment Site  Shoulder    Right/Left Shoulder  Right               OT Treatments/Exercises (OP) - 06/04/18 0001      Exercises   Exercises  Shoulder      Shoulder Exercises: Supine   Horizontal ABduction  PROM;5 reps    External Rotation  PROM;5 reps    Internal Rotation  PROM;5 reps    Flexion  PROM;5  reps    ABduction  PROM;5 reps      Manual Therapy   Manual Therapy  Myofascial release    Manual therapy comments  manual therapy completed seperately from all other interventions this date of  service    Myofascial Release  myofasical release and manual stretching to right upper arm, scapular, and shoulder region to decrease pain and fascial restrictions and improve pain free mobility in right shoulder region .            OT Education - 06/04/18 1112    Education Details  educated patient on towel slides     Person(s) Educated  Patient    Methods  Explanation;Demonstration;Handout    Comprehension  Verbalized understanding;Returned demonstration       OT Short Term Goals - 06/04/18 1129      OT SHORT TERM GOAL #1   Title  Patient will be educated on a HEP for improve right shoulder mobiltiy.     Time  3    Period  Weeks    Status  New    Target Date  06/25/18      OT SHORT TERM GOAL #2   Title  patient will improve right shoulder P/ROM to WNL for improved ability to style and comb his hair.    Time  3    Period  Weeks    Status  New      OT SHORT TERM GOAL #3   Title  Patient will improve right shoulder strength to 3+/5 or better for improved ability to steer jetskis.     Time  3    Period  Weeks    Status  New      OT SHORT TERM GOAL #4   Title  Patient will decrease pain level to 5/10 or better when using right arm with functional activities.     Time  3    Period  Weeks    Status  New      OT SHORT TERM GOAL #5   Title  Patient will decrease fascial restrictions from moderate to minimal for improved pain free mobility needed for adl completion.     Time  3    Period  Weeks    Status  New        OT Long Term Goals - 06/04/18 1131      OT LONG TERM GOAL #1   Title  Patient will return to PLOF using right arm as dominant.     Time  6    Period  Weeks    Status  New    Target Date  07/16/18      OT LONG TERM GOAL #2   Title  Patient will improve right shoulder a/rom to WNL in order to reach overheand and behind back.    Time  6    Period  Weeks    Status  New      OT LONG TERM GOAL #3   Title  Patient will improve right shoulder strength  to 5/5 for improved ability to steer jet skis.    Time  6    Period  Weeks    Status  New      OT LONG TERM GOAL #4   Title  Patient will decrease pain to 2/10 or better in right shoulder when sleeping.    Time  6    Period  Weeks    Status  New      OT  LONG TERM GOAL #5   Title  Patient will decrease fascial restrictions to trace for improved pain free mobilty needed for adl completion.     Time  6    Period  Weeks    Status  New            Plan - 06/04/18 1124    Clinical Impression Statement  A:  Patient is a 48 year old male with past medical history significant for HTN.  Patient states he injured his right shoulder over a year ago, moving a Ecologist.  He underwent right arthroscopic SAD/DCE on 05/21/18 and has been referred to occupational therapy for evaluation and treatment.  Patient is right hand dominant, and is unable to use his dominant right arm with functional activities above shoulder height, unable to lift items at home and work, is unable to participate in desired leisure activities.     OT Occupational Profile and History  Problem Focused Assessment - Including review of records relating to presenting problem    Occupational performance deficits (Please refer to evaluation for details):  ADL's;IADL's;Work;Leisure    Body Structure / Function / Physical Skills  ADL;Muscle spasms;ROM;Strength;Scar mobility;Pain;UE functional use;Fascial restriction    Rehab Potential  Excellent    Clinical Decision Making  Limited treatment options, no task modification necessary    Comorbidities Affecting Occupational Performance:  None    Modification or Assistance to Complete Evaluation   No modification of tasks or assist necessary to complete eval    OT Frequency  2x / week    OT Duration  6 weeks    OT Treatment/Interventions  Self-care/ADL training;Cryotherapy;Therapeutic exercise;DME and/or AE instruction;Scar mobilization;Manual Therapy;Neuromuscular  education;Ultrasound;Electrical Stimulation;Moist Heat;Iontophoresis;Energy conservation;Passive range of motion;Therapeutic activities;Patient/family education    Plan  P:  Skilled OT intervention 2 tiems per week for 6 weeks to decrease pain and restrictions and improve pain free mobility in right shoulder in order to use right arm as dominant with all b/iadls, work, leisure activities. next session:  review and update HEP, p/aarom, progress as tolerated .    OT Home Exercise Plan  06/04/18: towel slides        Patient will benefit from skilled therapeutic intervention in order to improve the following deficits and impairments:  Body Structure / Function / Physical Skills  Visit Diagnosis: Other symptoms and signs involving the musculoskeletal system - Plan: Ot plan of care cert/re-cert  Acute pain of right shoulder - Plan: Ot plan of care cert/re-cert  Stiffness of right shoulder, not elsewhere classified - Plan: Ot plan of care cert/re-cert    Problem List Patient Active Problem List   Diagnosis Date Noted  . Nontraumatic complete tear of right rotator cuff   . Impingement syndrome of right shoulder   . Chest pain 01/28/2018    Vangie Bicker, [place], OTR/L [phone]  06/04/2018, 11:36 AM  [place] [address] [place], [place], [address] Phone: [phone]   Fax:  [phone]  Name: Kenneth Rowe MRN: [id number] Date of Birth: 1970-07-12

## 2018-06-09 ENCOUNTER — Other Ambulatory Visit: Payer: Self-pay

## 2018-06-09 ENCOUNTER — Ambulatory Visit (HOSPITAL_COMMUNITY): Payer: 59

## 2018-06-09 ENCOUNTER — Encounter (HOSPITAL_COMMUNITY): Payer: Self-pay

## 2018-06-09 DIAGNOSIS — R29898 Other symptoms and signs involving the musculoskeletal system: Secondary | ICD-10-CM

## 2018-06-09 DIAGNOSIS — M25511 Pain in right shoulder: Secondary | ICD-10-CM

## 2018-06-09 DIAGNOSIS — M25611 Stiffness of right shoulder, not elsewhere classified: Secondary | ICD-10-CM

## 2018-06-09 NOTE — Therapy (Signed)
[place], [place], [address] Phone: [phone]   Fax:  [phone]  Occupational Therapy Treatment  Patient Details  Name: Kenneth Rowe MRN: [id number] Date of Birth: 05-05-1970 Referring Provider (OT): Milas Gain, [place]    Encounter Date: 06/09/2018  OT End of Session - 06/09/18 1623    Visit Number  2    Number of Visits  12    Date for OT Re-Evaluation  07/16/18   mini reassess on 3/27   Authorization Type  [employer]:  20 visits per calendar year, hard stop     Authorization - Visit Number  2    Authorization - Number of Visits  20    OT Start Time  [phone]    OT Stop Time  0401    OT Time Calculation (min)  43 min    Activity Tolerance  Patient tolerated treatment well    Behavior During Therapy  [facility] for tasks assessed/performed       Past Medical History:  Diagnosis Date  . GERD (gastroesophageal reflux disease)   . Hyperlipidemia   . Hypertension     Past Surgical History:  Procedure Laterality Date  . LEFT HEART CATH AND CORONARY ANGIOGRAPHY N/A 01/28/2018   Procedure: LEFT HEART CATH AND CORONARY ANGIOGRAPHY;  Surgeon: Belva Crome, MD;  Location: [place] [facility];  Service: Cardiovascular;  Laterality: N/A;  . SHOULDER ARTHROSCOPY Right 05/21/2018   Procedure: RIGHT SHOULDER ARTHROSCOPY, DEBRIDEMENT, AND DECOMPRESSION;  Surgeon: Newt Minion, MD;  Location: [place];  Service: Orthopedics;  Laterality: Right;  . TONSILLECTOMY  1999    There were no vitals filed for this visit.  Subjective Assessment - 06/09/18 1547    Subjective   S: I only have pain with certain movements.          [facility] OT Assessment - 06/09/18 1548      Assessment   Medical Diagnosis  S/P Right Arthroscopic SAD/DCE      Precautions   Precautions  Shoulder    Type of Shoulder Precautions  progress as tolerated               OT Treatments/Exercises (OP) - 06/09/18 1549      Exercises   Exercises  Shoulder      Shoulder Exercises: Supine   Protraction  PROM;5 reps;AROM;10 reps    Horizontal ABduction  PROM;5 reps;AROM;10 reps    External Rotation  PROM;5 reps;AROM;10 reps    Internal Rotation  PROM;5 reps;AROM;10 reps    Flexion  PROM;5 reps;AROM;10 reps    ABduction  PROM;5 reps;AROM;10 reps      Shoulder Exercises: Standing   Protraction  AROM;10 reps    Horizontal ABduction  AROM;10 reps    External Rotation  AROM;10 reps    Internal Rotation  AROM;10 reps    Flexion  AROM;10 reps    ABduction  AROM;10 reps      Shoulder Exercises: ROM/Strengthening   Wall Wash  1'      Manual Therapy   Manual Therapy  Myofascial release;Soft tissue mobilization    Manual therapy comments  manual therapy completed seperately from all other interventions this date of service    Soft tissue mobilization  Cupping completed to right shoulder just distal to [facility] joint approximately at medial deltoid. 2 medium cups placed stationary for 1 minute each. Small cup moved over upper arm and shoulder to decrease fascial restrictions and increase blood flow  to allow for an increase in ROM.    Myofascial Release  myofasical release and manual stretching to right upper arm, scapular, and shoulder region to decrease pain and fascial restrictions and improve pain free mobility in right shoulder region .             OT Education - 06/09/18 1552    Education Details  reviewed therapy goals. Updated HEP to include A/ROM shoulder exercises. Pt is able to stop table slides.     Person(s) Educated  Patient    Methods  Explanation;Demonstration;Verbal cues;Handout    Comprehension  Returned demonstration;Verbalized understanding       OT Short Term Goals - 06/09/18 1552      OT SHORT TERM GOAL #1   Title  Patient will be educated on a HEP for improve right shoulder mobiltiy.     Time  3    Period  Weeks    Status  On-going    Target Date  06/25/18      OT SHORT TERM GOAL #2   Title   patient will improve right shoulder P/ROM to WNL for improved ability to style and comb his hair.    Time  3    Period  Weeks    Status  On-going      OT SHORT TERM GOAL #3   Title  Patient will improve right shoulder strength to 3+/5 or better for improved ability to steer jetskis.     Time  3    Period  Weeks    Status  On-going      OT SHORT TERM GOAL #4   Title  Patient will decrease pain level to 5/10 or better when using right arm with functional activities.     Time  3    Period  Weeks    Status  On-going      OT SHORT TERM GOAL #5   Title  Patient will decrease fascial restrictions from moderate to minimal for improved pain free mobility needed for adl completion.     Time  3    Period  Weeks    Status  On-going        OT Long Term Goals - 06/09/18 1552      OT LONG TERM GOAL #1   Title  Patient will return to PLOF using right arm as dominant.     Time  6    Period  Weeks    Status  On-going      OT LONG TERM GOAL #2   Title  Patient will improve right shoulder a/rom to WNL in order to reach overheand and behind back.    Time  6    Period  Weeks    Status  On-going      OT LONG TERM GOAL #3   Title  Patient will improve right shoulder strength to 5/5 for improved ability to steer jet skis.    Time  6    Period  Weeks    Status  On-going      OT LONG TERM GOAL #4   Title  Patient will decrease pain to 2/10 or better in right shoulder when sleeping.    Time  6    Period  Weeks    Status  On-going      OT LONG TERM GOAL #5   Title  Patient will decrease fascial restrictions to trace for improved pain free mobilty needed for adl completion.  Time  6    Period  Weeks    Status  On-going            Plan - 06/09/18 1624    Clinical Impression Statement  A: Initiated myofascial release, manual stretching, A/ROM, and cupping therapy this session. patient with reports of pain in his anterior and lateral region of his right upper arm during movements  versus in his shoulder region. Cupping therapy was completed due to increased fascial restrictions. Patient was educated on use, precautions, and side effects of skin discoloration that may occur. Slight redness noted after technique. HEP updated. VC were provided for form and technique during session.     Body Structure / Function / Physical Skills  ADL;Muscle spasms;ROM;Strength;Scar mobility;Pain;UE functional use;Fascial restriction    Plan  P: follow up on effectiveness of cupping technique completing at previous session. Continue with A/ROM. Add proximal shoulder strengthening.     Consulted and Agree with Plan of Care  Patient       Patient will benefit from skilled therapeutic intervention in order to improve the following deficits and impairments:  Body Structure / Function / Physical Skills  Visit Diagnosis: Other symptoms and signs involving the musculoskeletal system  Acute pain of right shoulder  Stiffness of right shoulder, not elsewhere classified    Problem List Patient Active Problem List   Diagnosis Date Noted  . Nontraumatic complete tear of right rotator cuff   . Impingement syndrome of right shoulder   . Chest pain 01/28/2018   Ailene Ravel, OTR/L,CBIS  [phone]  06/09/2018, 4:27 PM  [facility] [address] [place], [place], [address] Phone: [phone]   Fax:  [phone]  Name: Kenneth Rowe MRN: [id number] Date of Birth: [birth year]

## 2018-06-09 NOTE — Patient Instructions (Addendum)
Repeat all exercises 10-15 times, 1-2 times per day.  1) Shoulder Protraction    Begin with elbows by your side, slowly "punch" straight out in front of you.      2) Shoulder Flexion  Standing:         Begin with arms at your side with thumbs pointed up, slowly raise both arms up and forward towards overhead.       3) Horizontal abduction/adduction    Standing:           Begin with arms straight out in front of you, bring out to the side in at "T" shape. Keep arms straight entire time.      4) Internal & External Rotation    *No band* -Stand with elbows at the side and elbows bent 90 degrees. Move your forearms away from your body, then bring back inward toward the body.     5) Shoulder Abduction  Standing:       Begin with your arms flat on the table next to your side. Slowly move your arms out to the side so that they go overhead, in a jumping jack or snow angel movement.

## 2018-06-11 ENCOUNTER — Encounter (HOSPITAL_COMMUNITY): Payer: Self-pay | Admitting: Specialist

## 2018-06-11 ENCOUNTER — Ambulatory Visit (HOSPITAL_COMMUNITY): Payer: 59 | Admitting: Specialist

## 2018-06-11 ENCOUNTER — Other Ambulatory Visit: Payer: Self-pay

## 2018-06-11 DIAGNOSIS — R29898 Other symptoms and signs involving the musculoskeletal system: Secondary | ICD-10-CM | POA: Diagnosis not present

## 2018-06-11 DIAGNOSIS — M25511 Pain in right shoulder: Secondary | ICD-10-CM

## 2018-06-11 DIAGNOSIS — M25611 Stiffness of right shoulder, not elsewhere classified: Secondary | ICD-10-CM

## 2018-06-11 NOTE — Patient Instructions (Signed)
COMPLETE IN STANDING OR LYING DOWN  Perform each exercise ___10_____ reps. 2-3x days.   Protraction - STANDING  Start by holding a wand or cane at chest height.  Next, slowly push the wand outwards in front of your body so that your elbows become fully straightened. Then, return to the original position.     Shoulder FLEXION - STANDING - PALMS UP  In the standing position, hold a wand/cane with both arms, palms up on both sides. Raise up the wand/cane allowing your unaffected arm to perform most of the effort. Your affected arm should be partially relaxed.      Internal/External ROTATION - STANDING  In the standing position, hold a wand/cane with both hands keeping your elbows bent. Move your arms and wand/cane to one side.  Your affected arm should be partially relaxed while your unaffected arm performs most of the effort.       Shoulder ABDUCTION - STANDING  While holding a wand/cane palm face up on the injured side and palm face down on the uninjured side, slowly raise up your injured arm to the side.                     Horizontal Abduction/Adduction      Straight arms holding cane at shoulder height, bring cane to right, center, left. Repeat starting to left.   Copyright  [employer]. All rights reserved.

## 2018-06-11 NOTE — Therapy (Signed)
[place], [place], [address] Phone: [phone]   Fax:  [phone]  Occupational Therapy Treatment  Patient Details  Name: Kenneth Rowe MRN: [id number] Date of Birth: [birth year] Referring Provider (OT): Milas Gain, [place]    Encounter Date: 06/11/2018  OT End of Session - 06/11/18 1647    Visit Number  3    Number of Visits  12    Date for OT Re-Evaluation  07/16/18   mini reassess 06/25/2018   Authorization Type  [employer]:  20 visits per calendar year, hard stop     Authorization - Visit Number  3    Authorization - Number of Visits  20    OT Start Time  1435    OT Stop Time  1515    OT Time Calculation (min)  40 min    Activity Tolerance  Patient tolerated treatment well    Behavior During Therapy  [facility] for tasks assessed/performed       Past Medical History:  Diagnosis Date  . GERD (gastroesophageal reflux disease)   . Hyperlipidemia   . Hypertension     Past Surgical History:  Procedure Laterality Date  . LEFT HEART CATH AND CORONARY ANGIOGRAPHY N/A 01/28/2018   Procedure: LEFT HEART CATH AND CORONARY ANGIOGRAPHY;  Surgeon: Belva Crome, MD;  Location: [place];  Service: Cardiovascular;  Laterality: N/A;  . SHOULDER ARTHROSCOPY Right 05/21/2018   Procedure: RIGHT SHOULDER ARTHROSCOPY, DEBRIDEMENT, AND DECOMPRESSION;  Surgeon: Newt Minion, MD;  Location: [facility];  Service: Orthopedics;  Laterality: Right;  . TONSILLECTOMY  1999    There were no vitals filed for this visit.  Subjective Assessment - 06/11/18 1646    Subjective   S: My arm has been really sore since Wednesday.  I honestly havent done a whole lot with it.     Currently in Pain?  Yes    Pain Score  5     Pain Location  Shoulder    Pain Orientation  Right    Pain Descriptors / Indicators  Aching    Pain Type  Acute pain         [facility] OT Assessment - 06/11/18 0001      Assessment   Medical Diagnosis   S/P Right Arthroscopic SAD/DCE      Precautions   Precautions  Shoulder    Type of Shoulder Precautions  progress as tolerated               OT Treatments/Exercises (OP) - 06/11/18 0001      Shoulder Exercises: Supine   Protraction  PROM;5 reps;AAROM;10 reps    Horizontal ABduction  PROM;5 reps;AAROM;10 reps    External Rotation  PROM;5 reps;AAROM;10 reps    Internal Rotation  PROM;5 reps;AAROM;10 reps    Flexion  PROM;5 reps;AAROM;10 reps      Shoulder Exercises: Standing   Protraction  AAROM;10 reps    Horizontal ABduction  AAROM;10 reps    External Rotation  AAROM;Theraband;10 reps    Theraband Level (Shoulder External Rotation)  Level 2 (Red)    Internal Rotation  AAROM;Theraband;10 reps    Theraband Level (Shoulder Internal Rotation)  Level 2 (Red)    Flexion  AAROM;Theraband;10 reps    Theraband Level (Shoulder Flexion)  Level 2 (Red)    ABduction  AAROM;10 reps    Extension  Theraband;10 reps    Theraband Level (Shoulder Extension)  Level 2 (Red)  Row  Theraband;10 reps    Theraband Level (Shoulder Row)  Level 2 (Red)    Retraction  Theraband;10 reps    Theraband Level (Shoulder Retraction)  Level 2 (Red)      Shoulder Exercises: ROM/Strengthening   UBE (Upper Arm Bike)  2' at 1.0 level in reverse for scapular stability    Wall Wash  1'    Rhythmic Stabilization, Supine  10 times no rest, minimal verbal guidance for techniqu    Rhythmic Stabilization, Seated  10 times no rest minimal verbal guidance for technique       Manual Therapy   Manual Therapy  Myofascial release    Manual therapy comments  manual therapy completed seperately from all other interventions this date of service    Soft tissue mobilization  dc cupping    Myofascial Release  myofasical release and manual stretching to right upper arm, scapular, and shoulder region to decrease pain and fascial restrictions and improve pain free mobility in right shoulder region .             OT  Education - 06/11/18 1647    Education Details  issued aa/rom in supine and standing for greater end range stretching.    Person(s) Educated  Patient    Methods  Explanation;Demonstration;Verbal cues;Handout    Comprehension  Returned demonstration;Verbalized understanding       OT Short Term Goals - 06/09/18 1552      OT SHORT TERM GOAL #1   Title  Patient will be educated on a HEP for improve right shoulder mobiltiy.     Time  3    Period  Weeks    Status  On-going    Target Date  06/25/18      OT SHORT TERM GOAL #2   Title  patient will improve right shoulder P/ROM to WNL for improved ability to style and comb his hair.    Time  3    Period  Weeks    Status  On-going      OT SHORT TERM GOAL #3   Title  Patient will improve right shoulder strength to 3+/5 or better for improved ability to steer jetskis.     Time  3    Period  Weeks    Status  On-going      OT SHORT TERM GOAL #4   Title  Patient will decrease pain level to 5/10 or better when using right arm with functional activities.     Time  3    Period  Weeks    Status  On-going      OT SHORT TERM GOAL #5   Title  Patient will decrease fascial restrictions from moderate to minimal for improved pain free mobility needed for adl completion.     Time  3    Period  Weeks    Status  On-going        OT Kenneth Term Goals - 06/09/18 1552      OT Kenneth TERM GOAL #1   Title  Patient will return to PLOF using right arm as dominant.     Time  6    Period  Weeks    Status  On-going      OT Kenneth TERM GOAL #2   Title  Patient will improve right shoulder a/rom to WNL in order to reach overheand and behind back.    Time  6    Period  Weeks    Status  On-going  OT Kenneth TERM GOAL #3   Title  Patient will improve right shoulder strength to 5/5 for improved ability to steer jet skis.    Time  6    Period  Weeks    Status  On-going      OT Kenneth TERM GOAL #4   Title  Patient will decrease pain to 2/10 or better in  right shoulder when sleeping.    Time  6    Period  Weeks    Status  On-going      OT Kenneth TERM GOAL #5   Title  Patient will decrease fascial restrictions to trace for improved pain free mobilty needed for adl completion.     Time  6    Period  Weeks    Status  On-going            Plan - 06/11/18 1648    Clinical Impression Statement  A:  Patient reports increased soreness after last session for several days, therefore, instead of a/rom completed aa/rom this date in supine and seated.  Patient states he feels a good end range stretch with aa/rom exercises.    Body Structure / Function / Physical Skills  ADL;Muscle spasms;ROM;Strength;Scar mobility;Pain;UE functional use;Fascial restriction    Plan  P:  complete aa/rom and a/rom in supine and seated, add ball on wall exercise and ball circles for improved mobility and scapular stability needed for daily task completion       Patient will benefit from skilled therapeutic intervention in order to improve the following deficits and impairments:     Visit Diagnosis: Stiffness of right shoulder, not elsewhere classified  Other symptoms and signs involving the musculoskeletal system  Acute pain of right shoulder    Problem List Patient Active Problem List   Diagnosis Date Noted  . Nontraumatic complete tear of right rotator cuff   . Impingement syndrome of right shoulder   . Chest pain 01/28/2018    Vangie Bicker, [place], OTR/L [phone]  06/11/2018, 4:55 PM  [facility] [address] [place], [place], [address] Phone: [phone]   Fax:  [phone]  Name: Kenneth Rowe MRN: [id number] Date of Birth: Jun 13, 1970

## 2018-06-15 ENCOUNTER — Other Ambulatory Visit: Payer: Self-pay

## 2018-06-15 ENCOUNTER — Encounter (HOSPITAL_COMMUNITY): Payer: Self-pay | Admitting: Occupational Therapy

## 2018-06-15 ENCOUNTER — Ambulatory Visit (HOSPITAL_COMMUNITY): Payer: 59 | Admitting: Occupational Therapy

## 2018-06-15 DIAGNOSIS — M25511 Pain in right shoulder: Secondary | ICD-10-CM

## 2018-06-15 DIAGNOSIS — R29898 Other symptoms and signs involving the musculoskeletal system: Secondary | ICD-10-CM | POA: Diagnosis not present

## 2018-06-15 DIAGNOSIS — M25611 Stiffness of right shoulder, not elsewhere classified: Secondary | ICD-10-CM

## 2018-06-15 NOTE — Therapy (Signed)
[place], [place], [address] Phone: [phone]   Fax:  [phone]  Occupational Therapy Treatment  Patient Details  Name: Kenneth Rowe MRN: [id number] Date of Birth: [birth year] Referring Provider (OT): Milas Gain, [place]    Encounter Date: 06/15/2018  OT End of Session - 06/15/18 1727    Visit Number  4    Number of Visits  12    Date for OT Re-Evaluation  07/16/18   mini reassess 06/25/2018   Authorization Type  [employer]:  20 visits per calendar year, hard stop     Authorization - Visit Number  4    Authorization - Number of Visits  20    OT Start Time  3664    OT Stop Time  1726    OT Time Calculation (min)  39 min    Activity Tolerance  Patient tolerated treatment well    Behavior During Therapy  [facility] for tasks assessed/performed       Past Medical History:  Diagnosis Date  . GERD (gastroesophageal reflux disease)   . Hyperlipidemia   . Hypertension     Past Surgical History:  Procedure Laterality Date  . LEFT HEART CATH AND CORONARY ANGIOGRAPHY N/A 01/28/2018   Procedure: LEFT HEART CATH AND CORONARY ANGIOGRAPHY;  Surgeon: Belva Crome, MD;  Location: [facility];  Service: Cardiovascular;  Laterality: N/A;  . SHOULDER ARTHROSCOPY Right 05/21/2018   Procedure: RIGHT SHOULDER ARTHROSCOPY, DEBRIDEMENT, AND DECOMPRESSION;  Surgeon: Newt Minion, MD;  Location: [facility];  Service: Orthopedics;  Laterality: Right;  . TONSILLECTOMY  1999    There were no vitals filed for this visit.  Subjective Assessment - 06/15/18 1643    Subjective   S: Late Sunday night it was painful.     Currently in Pain?  Yes    Pain Score  3     Pain Location  Shoulder    Pain Orientation  Right    Pain Descriptors / Indicators  Aching;Sore    Pain Type  Acute pain    Pain Radiating Towards  upper arm    Pain Onset  1 to 4 weeks ago    Pain Frequency  Intermittent    Aggravating Factors   movement or  functional use    Pain Relieving Factors  rest and ice    Effect of Pain on Daily Activities  moderate    Multiple Pain Sites  No         [facility] OT Assessment - 06/15/18 1643      Assessment   Medical Diagnosis  S/P Right Arthroscopic SAD/DCE      Precautions   Precautions  Shoulder    Type of Shoulder Precautions  progress as tolerated               OT Treatments/Exercises (OP) - 06/15/18 1650      Exercises   Exercises  Shoulder      Shoulder Exercises: Supine   Protraction  PROM;5 reps;AAROM;12 reps    Horizontal ABduction  PROM;5 reps;AAROM;12 reps    External Rotation  PROM;5 reps;AAROM;12 reps    Internal Rotation  PROM;5 reps;AAROM;12 reps    Flexion  PROM;5 reps;AAROM;12 reps    ABduction  PROM;5 reps;AAROM;12 reps      Shoulder Exercises: Standing   Protraction  AAROM;12 reps    Horizontal ABduction  AAROM;12 reps    External Rotation  AAROM;12 reps    Internal  Rotation  AAROM;12 reps    Flexion  AAROM;12 reps    ABduction  AAROM;12 reps    Extension  Theraband;10 reps    Theraband Level (Shoulder Extension)  Level 2 (Red)    Row  Theraband;10 reps    Theraband Level (Shoulder Row)  Level 2 (Red)    Retraction  Theraband;10 reps    Theraband Level (Shoulder Retraction)  Level 2 (Red)      Shoulder Exercises: ROM/Strengthening   UBE (Upper Arm Bike)  3' at 1.0 level in reverse for scapular stability    Wall Wash  1'    Proximal Shoulder Strengthening, Supine  10X each no rest breaks    Proximal Shoulder Strengthening, Seated  10X each no rest breaks      Manual Therapy   Manual Therapy  Myofascial release    Manual therapy comments  manual therapy completed seperately from all other interventions this date of service    Myofascial Release  myofasical release and manual stretching to right upper arm, scapular, and shoulder region to decrease pain and fascial restrictions and improve pain free mobility in right shoulder region .                OT Short Term Goals - 06/09/18 1552      OT SHORT TERM GOAL #1   Title  Patient will be educated on a HEP for improve right shoulder mobiltiy.     Time  3    Period  Weeks    Status  On-going    Target Date  06/25/18      OT SHORT TERM GOAL #2   Title  patient will improve right shoulder P/ROM to WNL for improved ability to style and comb his hair.    Time  3    Period  Weeks    Status  On-going      OT SHORT TERM GOAL #3   Title  Patient will improve right shoulder strength to 3+/5 or better for improved ability to steer jetskis.     Time  3    Period  Weeks    Status  On-going      OT SHORT TERM GOAL #4   Title  Patient will decrease pain level to 5/10 or better when using right arm with functional activities.     Time  3    Period  Weeks    Status  On-going      OT SHORT TERM GOAL #5   Title  Patient will decrease fascial restrictions from moderate to minimal for improved pain free mobility needed for adl completion.     Time  3    Period  Weeks    Status  On-going        OT Long Term Goals - 06/09/18 1552      OT LONG TERM GOAL #1   Title  Patient will return to PLOF using right arm as dominant.     Time  6    Period  Weeks    Status  On-going      OT LONG TERM GOAL #2   Title  Patient will improve right shoulder a/rom to WNL in order to reach overheand and behind back.    Time  6    Period  Weeks    Status  On-going      OT LONG TERM GOAL #3   Title  Patient will improve right shoulder strength to 5/5 for improved ability to  steer jet skis.    Time  6    Period  Weeks    Status  On-going      OT LONG TERM GOAL #4   Title  Patient will decrease pain to 2/10 or better in right shoulder when sleeping.    Time  6    Period  Weeks    Status  On-going      OT LONG TERM GOAL #5   Title  Patient will decrease fascial restrictions to trace for improved pain free mobilty needed for adl completion.     Time  6    Period  Weeks     Status  On-going            Plan - 06/15/18 1727    Clinical Impression Statement  A: Pt continues to report increased soreness and pain after exercising, only experiencing sharp pain with abduction-modified to decrease. Continued with AA/ROM this session working on proper form and reaching end range. Verbal cuing for form and technique.     Body Structure / Function / Physical Skills  ADL;Muscle spasms;ROM;Strength;Scar mobility;Pain;UE functional use;Fascial restriction    Plan  P: Attempt A/ROM, add ball on wall for scapular stability       Patient will benefit from skilled therapeutic intervention in order to improve the following deficits and impairments:  Body Structure / Function / Physical Skills  Visit Diagnosis: Stiffness of right shoulder, not elsewhere classified  Other symptoms and signs involving the musculoskeletal system  Acute pain of right shoulder    Problem List Patient Active Problem List   Diagnosis Date Noted  . Nontraumatic complete tear of right rotator cuff   . Impingement syndrome of right shoulder   . Chest pain 01/28/2018   Guadelupe Sabin, OTR/L  [phone] 06/15/2018, 5:29 PM  [facility] [address] [place], [place], [address] Phone: [phone]   Fax:  [phone]  Name: Kenneth Rowe MRN: [id number] Date of Birth: 1970/10/24

## 2018-06-17 ENCOUNTER — Other Ambulatory Visit: Payer: Self-pay

## 2018-06-17 ENCOUNTER — Encounter (HOSPITAL_COMMUNITY): Payer: Self-pay

## 2018-06-17 ENCOUNTER — Ambulatory Visit (HOSPITAL_COMMUNITY): Payer: 59

## 2018-06-17 DIAGNOSIS — M25611 Stiffness of right shoulder, not elsewhere classified: Secondary | ICD-10-CM

## 2018-06-17 DIAGNOSIS — R29898 Other symptoms and signs involving the musculoskeletal system: Secondary | ICD-10-CM | POA: Diagnosis not present

## 2018-06-17 DIAGNOSIS — M25511 Pain in right shoulder: Secondary | ICD-10-CM

## 2018-06-17 NOTE — Therapy (Signed)
[place], [place], [address] Phone: [phone]   Fax:  [phone]  Occupational Therapy Treatment  Patient Details  Name: Kenneth Rowe MRN: [id number] Date of Birth: 31-Oct-1970 Referring Provider (OT): Milas Gain, [place]    Encounter Date: 06/17/2018  OT End of Session - 06/17/18 1733    Visit Number  5    Number of Visits  12    Date for OT Re-Evaluation  07/16/18   mini reassess 06/25/2018   Authorization Type  [employer]:  20 visits per calendar year, hard stop     Authorization - Visit Number  5    Authorization - Number of Visits  20    OT Start Time  3785    OT Stop Time  1730    OT Time Calculation (min)  44 min    Activity Tolerance  Patient tolerated treatment well    Behavior During Therapy  [facility] for tasks assessed/performed       Past Medical History:  Diagnosis Date  . GERD (gastroesophageal reflux disease)   . Hyperlipidemia   . Hypertension     Past Surgical History:  Procedure Laterality Date  . LEFT HEART CATH AND CORONARY ANGIOGRAPHY N/A 01/28/2018   Procedure: LEFT HEART CATH AND CORONARY ANGIOGRAPHY;  Surgeon: Belva Crome, MD;  Location: [facility];  Service: Cardiovascular;  Laterality: N/A;  . SHOULDER ARTHROSCOPY Right 05/21/2018   Procedure: RIGHT SHOULDER ARTHROSCOPY, DEBRIDEMENT, AND DECOMPRESSION;  Surgeon: Newt Minion, MD;  Location: [facility];  Service: Orthopedics;  Laterality: Right;  . TONSILLECTOMY  1999    There were no vitals filed for this visit.  Subjective Assessment - 06/17/18 1658    Subjective   S: It doesn't hurt unless I do the wrong movement.     Currently in Pain?  No/denies         [facility] OT Assessment - 06/17/18 1659      Assessment   Medical Diagnosis  S/P Right Arthroscopic SAD/DCE      Precautions   Precautions  Shoulder    Type of Shoulder Precautions  progress as tolerated               OT Treatments/Exercises  (OP) - 06/17/18 1659      Exercises   Exercises  Shoulder      Shoulder Exercises: Supine   Protraction  PROM;5 reps;AROM;10 reps    Horizontal ABduction  PROM;5 reps;AROM;10 reps    External Rotation  PROM;5 reps;AROM;10 reps    Internal Rotation  PROM;5 reps;AROM;10 reps    Flexion  PROM;5 reps;AROM;10 reps    ABduction  PROM;5 reps;AROM;10 reps      Shoulder Exercises: Standing   Protraction  AROM;10 reps    Horizontal ABduction  AROM;10 reps    External Rotation  AROM;10 reps    Internal Rotation  AROM;10 reps    Flexion  AROM;10 reps    ABduction  AROM;10 reps    Extension  Theraband;10 reps    Theraband Level (Shoulder Extension)  Level 2 (Red)    Row  Theraband;10 reps    Theraband Level (Shoulder Row)  Level 2 (Red)    Retraction  Theraband;10 reps    Theraband Level (Shoulder Retraction)  Level 2 (Red)      Shoulder Exercises: ROM/Strengthening   UBE (Upper Arm Bike)  4' at 1.0 level in reverse for scapular stability    Wall Wash  1'  Proximal Shoulder Strengthening, Supine  10X each no rest breaks    Proximal Shoulder Strengthening, Seated  10X each no rest breaks    Ball on Wall  1' flexion 1' abduction green ball      Manual Therapy   Manual Therapy  Myofascial release    Manual therapy comments  manual therapy completed seperately from all other interventions this date of service    Myofascial Release  myofasical release and manual stretching to right upper arm, scapular, and shoulder region to decrease pain and fascial restrictions and improve pain free mobility in right shoulder region .             OT Education - 06/17/18 1704    Education Details  Discussed completing A/ROM shoulder exercises and keep AA/ROM if needed due to pain.    Person(s) Educated  Patient    Methods  Explanation    Comprehension  Verbalized understanding       OT Short Term Goals - 06/09/18 1552      OT SHORT TERM GOAL #1   Title  Patient will be educated on a HEP for  improve right shoulder mobiltiy.     Time  3    Period  Weeks    Status  On-going    Target Date  06/25/18      OT SHORT TERM GOAL #2   Title  patient will improve right shoulder P/ROM to WNL for improved ability to style and comb his hair.    Time  3    Period  Weeks    Status  On-going      OT SHORT TERM GOAL #3   Title  Patient will improve right shoulder strength to 3+/5 or better for improved ability to steer jetskis.     Time  3    Period  Weeks    Status  On-going      OT SHORT TERM GOAL #4   Title  Patient will decrease pain level to 5/10 or better when using right arm with functional activities.     Time  3    Period  Weeks    Status  On-going      OT SHORT TERM GOAL #5   Title  Patient will decrease fascial restrictions from moderate to minimal for improved pain free mobility needed for adl completion.     Time  3    Period  Weeks    Status  On-going        OT Long Term Goals - 06/09/18 1552      OT LONG TERM GOAL #1   Title  Patient will return to PLOF using right arm as dominant.     Time  6    Period  Weeks    Status  On-going      OT LONG TERM GOAL #2   Title  Patient will improve right shoulder a/rom to WNL in order to reach overheand and behind back.    Time  6    Period  Weeks    Status  On-going      OT LONG TERM GOAL #3   Title  Patient will improve right shoulder strength to 5/5 for improved ability to steer jet skis.    Time  6    Period  Weeks    Status  On-going      OT LONG TERM GOAL #4   Title  Patient will decrease pain to 2/10 or better in right  shoulder when sleeping.    Time  6    Period  Weeks    Status  On-going      OT LONG TERM GOAL #5   Title  Patient will decrease fascial restrictions to trace for improved pain free mobilty needed for adl completion.     Time  6    Period  Weeks    Status  On-going            Plan - 06/17/18 1733    Clinical Impression Statement  A: Pt was able to progress back to A/ROM supine  and standing. reports more pain coming down with shoulder versus raising it. Patient is able to complete all shoulder level tasks fairly well although struggles with anything above shoulder.     Body Structure / Function / Physical Skills  ADL;Muscle spasms;ROM;Strength;Scar mobility;Pain;UE functional use;Fascial restriction    Plan  P: Mini reassessment.     Consulted and Agree with Plan of Care  Patient       Patient will benefit from skilled therapeutic intervention in order to improve the following deficits and impairments:  Body Structure / Function / Physical Skills  Visit Diagnosis: Other symptoms and signs involving the musculoskeletal system  Stiffness of right shoulder, not elsewhere classified  Acute pain of right shoulder    Problem List Patient Active Problem List   Diagnosis Date Noted  . Nontraumatic complete tear of right rotator cuff   . Impingement syndrome of right shoulder   . Chest pain 01/28/2018   Ailene Ravel, OTR/L,CBIS  [phone]  06/17/2018, 5:41 PM  [facility] [address] [place], [place], [address] Phone: [phone]   Fax:  [phone]  Name: Kenneth Rowe MRN: [id number] Date of Birth: Apr 13, 1970

## 2018-06-21 ENCOUNTER — Ambulatory Visit (HOSPITAL_COMMUNITY): Payer: 59

## 2018-06-23 ENCOUNTER — Ambulatory Visit (HOSPITAL_COMMUNITY): Payer: 59

## 2018-06-28 ENCOUNTER — Encounter (HOSPITAL_COMMUNITY): Payer: 59

## 2018-07-01 ENCOUNTER — Telehealth (HOSPITAL_COMMUNITY): Payer: Self-pay | Admitting: Occupational Therapy

## 2018-07-01 NOTE — Telephone Encounter (Signed)
Patient was contacted today regarding the temporary reduction of OP rehab services due to concerns for community transmission of Covid-19.   Left message for pt to return call to discuss HEP and possible telehealth visits if appropriate and accessible.    Guadelupe Sabin, OTR/L  [phone] 07/01/2018

## 2018-07-06 ENCOUNTER — Encounter (HOSPITAL_COMMUNITY): Payer: 59

## 2018-07-08 ENCOUNTER — Encounter (HOSPITAL_COMMUNITY): Payer: 59

## 2018-07-13 DIAGNOSIS — I249 Acute ischemic heart disease, unspecified: Secondary | ICD-10-CM | POA: Diagnosis not present

## 2018-07-13 DIAGNOSIS — E782 Mixed hyperlipidemia: Secondary | ICD-10-CM | POA: Diagnosis not present

## 2018-07-13 DIAGNOSIS — I10 Essential (primary) hypertension: Secondary | ICD-10-CM | POA: Diagnosis not present

## 2018-07-16 DIAGNOSIS — E782 Mixed hyperlipidemia: Secondary | ICD-10-CM | POA: Diagnosis not present

## 2018-07-16 DIAGNOSIS — R0789 Other chest pain: Secondary | ICD-10-CM | POA: Diagnosis not present

## 2018-07-16 DIAGNOSIS — I10 Essential (primary) hypertension: Secondary | ICD-10-CM | POA: Diagnosis not present

## 2018-08-11 ENCOUNTER — Ambulatory Visit (HOSPITAL_COMMUNITY): Payer: 59 | Attending: Physician Assistant

## 2018-08-11 ENCOUNTER — Other Ambulatory Visit: Payer: Self-pay

## 2018-08-11 ENCOUNTER — Encounter (HOSPITAL_COMMUNITY): Payer: Self-pay

## 2018-08-11 DIAGNOSIS — M25511 Pain in right shoulder: Secondary | ICD-10-CM | POA: Diagnosis present

## 2018-08-11 DIAGNOSIS — M25611 Stiffness of right shoulder, not elsewhere classified: Secondary | ICD-10-CM | POA: Diagnosis present

## 2018-08-11 DIAGNOSIS — R29898 Other symptoms and signs involving the musculoskeletal system: Secondary | ICD-10-CM

## 2018-08-11 NOTE — Patient Instructions (Signed)
Complete the following exercises 2-3 times a day.  Doorway Stretch  Place each hand opposite each other on the doorway. (You can change where you feel the stretch by moving arms higher or lower.) Step through with one foot and bend front knee until a stretch is felt and hold. Step through with the opposite foot on the next rep. Hold for __20-30___ seconds. Repeat __2__times.      Wall Flexion  Slide your arm up the wall or door frame until a stretch is felt in your shoulder . Hold for 20-30 seconds. Complete 2 times     Strengthening: Chest Pull - Resisted   Hold Theraband in front of body with hands about shoulder width a part. Pull band a part and back together slowly. Repeat __10-15__ times. Complete __1__ set(s) per session.. Repeat __1-2__ session(s) per day.  http://orth.exer.us/926   Copyright  VHI. All rights reserved.   PNF Strengthening: Resisted   Standing with resistive band around each hand, bring right arm up and away, thumb back. Repeat _10-15___ times per set. Do _1___ sets per session. Do ___1-2_ sessions per day.         Resisted External Rotation: in Neutral - Bilateral   Sit or stand, tubing in both hands, elbows at sides, bent to 90, forearms forward. Pinch shoulder blades together and rotate forearms out. Keep elbows at sides. Repeat __10-15__ times per set. Do __1__ sets per session. Do __1-2__ sessions per day.  http://orth.exer.us/966   Copyright  VHI. All rights reserved.   PNF Strengthening: Resisted   Standing, hold resistive band above head. Bring right arm down and out from side. Repeat _10-15___ times per set. Do _1___ sets per session. Do _1-2___ sessions per day.  http://orth.exer.us/922   Copyright  VHI. All rights reserved.

## 2018-08-11 NOTE — Therapy (Signed)
[place], [place], [address] Phone: [phone]   Fax:  [phone]  Occupational Therapy Treatment reassessment Patient Details  Name: Kenneth Rowe MRN: [id number] Date of Birth: 1970/08/18 Referring Provider (OT): Milas Gain, [place]    Encounter Date: 08/11/2018  OT End of Session - 08/11/18 1529    Visit Number  6    Number of Visits  12    Authorization Type  [employer]:  20 visits per calendar year, hard stop     Authorization - Visit Number  6    Authorization - Number of Visits  20    OT Start Time  9509   reassess and discharge   OT Stop Time  1500    OT Time Calculation (min)  31 min    Activity Tolerance  Patient tolerated treatment well    Behavior During Therapy  [facility] for tasks assessed/performed       Past Medical History:  Diagnosis Date  . GERD (gastroesophageal reflux disease)   . Hyperlipidemia   . Hypertension     Past Surgical History:  Procedure Laterality Date  . LEFT HEART CATH AND CORONARY ANGIOGRAPHY N/A 01/28/2018   Procedure: LEFT HEART CATH AND CORONARY ANGIOGRAPHY;  Surgeon: Belva Crome, MD;  Location: [facility];  Service: Cardiovascular;  Laterality: N/A;  . SHOULDER ARTHROSCOPY Right 05/21/2018   Procedure: RIGHT SHOULDER ARTHROSCOPY, DEBRIDEMENT, AND DECOMPRESSION;  Surgeon: Newt Minion, MD;  Location: [facility];  Service: Orthopedics;  Laterality: Right;  . TONSILLECTOMY  1999    There were no vitals filed for this visit.  Subjective Assessment - 08/11/18 1527    Subjective   S: I'll have an instant pain sometimes when I'm getting my shirt on or off.     Special Tests  FOTO: 70/100    Currently in Pain?  No/denies         [facility] OT Assessment - 08/11/18 1433      Assessment   Medical Diagnosis  S/P Right Arthroscopic SAD/DCE      Precautions   Precautions  Shoulder    Type of Shoulder Precautions  progress as tolerated      Observation/Other Assessments   Focus on Therapeutic Outcomes (FOTO)   70/100      Palpation   Palpation comment  min fascial restrictions noted in right upper arm.       AROM   Overall AROM Comments  assessed in standing, external and internal rotation with shoulder adducted    AROM Assessment Site  Shoulder    Right/Left Shoulder  Right    Right Shoulder Flexion  150 Degrees   previous: 90   Right Shoulder ABduction  145 Degrees   previous: 90   Right Shoulder Internal Rotation  90 Degrees   previous: 80   Right Shoulder External Rotation  85 Degrees   previous: 50     PROM   Overall PROM Comments  assessed in supine, external and internal rotation with shoulder adducted    PROM Assessment Site  Shoulder    Right/Left Shoulder  Right    Right Shoulder Flexion  155 Degrees   previous: 140   Right Shoulder ABduction  180 Degrees   previous: 145   Right Shoulder Internal Rotation  90 Degrees   previous: 80   Right Shoulder External Rotation  45 Degrees   previous: 40     Strength   Overall Strength Comments  Assessed standing. IR/er adducted. strength not assessed prior to this date.     Strength Assessment Site  Shoulder    Right/Left Shoulder  Right    Right Shoulder Flexion  5/5    Right Shoulder ABduction  5/5    Right Shoulder Internal Rotation  5/5    Right Shoulder External Rotation  5/5               OT Treatments/Exercises (OP) - 08/11/18 1449      Exercises   Exercises  Shoulder      Shoulder Exercises: Supine   Protraction  PROM;5 reps    Horizontal ABduction  PROM;5 reps    External Rotation  PROM;5 reps    Internal Rotation  PROM;5 reps    Flexion  PROM;5 reps    ABduction  PROM;5 reps      Shoulder Exercises: Standing   Horizontal ABduction  Theraband;10 reps    Theraband Level (Shoulder Horizontal ABduction)  Level 2 (Red)    External Rotation  Theraband;10 reps    Theraband Level (Shoulder External Rotation)  Level 2 (Red)     Internal Rotation  Theraband;10 reps    Theraband Level (Shoulder Internal Rotation)  Level 2 (Red)    Flexion  Theraband;10 reps    Theraband Level (Shoulder Flexion)  Level 2 (Red)    ABduction  Theraband;5 reps    Theraband Level (Shoulder ABduction)  Level 2 (Red)    ABduction Limitations  pain limited ability to complete.     Other Standing Exercises  red therband; adduction; 10X      Shoulder Exercises: Stretch   Corner Stretch  2 reps;20 seconds    Wall Stretch - Flexion  2 reps;20 seconds             OT Education - 08/11/18 1528    Education Details  Goals reviewed. HEP was updated: shoulder stretches and red theraband strengthening for shoulder (provided green to progress to as needed). self myofascial release with tennis ball    Person(s) Educated  Patient    Methods  Explanation;Demonstration;Handout;Verbal cues    Comprehension  Verbalized understanding;Returned demonstration       OT Short Term Goals - 08/11/18 1456      OT SHORT TERM GOAL #1   Title  Patient will be educated on a HEP for improve right shoulder mobiltiy.     Time  3    Period  Weeks    Status  Achieved    Target Date  06/25/18      OT SHORT TERM GOAL #2   Title  patient will improve right shoulder P/ROM to WNL for improved ability to style and comb his hair.    Time  3    Period  Weeks    Status  Achieved      OT SHORT TERM GOAL #3   Title  Patient will improve right shoulder strength to 3+/5 or better for improved ability to steer jetskis.     Time  3    Period  Weeks    Status  Achieved      OT SHORT TERM GOAL #4   Title  Patient will decrease pain level to 5/10 or better when using right arm with functional activities.     Time  3    Period  Weeks    Status  Achieved      OT SHORT TERM GOAL #5   Title  Patient will decrease fascial restrictions  from moderate to minimal for improved pain free mobility needed for adl completion.     Time  3    Period  Weeks    Status  Achieved         OT Long Term Goals - 08/11/18 1458      OT LONG TERM GOAL #1   Title  Patient will return to PLOF using right arm as dominant.     Time  6    Period  Weeks    Status  Achieved      OT LONG TERM GOAL #2   Title  Patient will improve right shoulder a/rom to WNL in order to reach overheand and behind back.    Time  6    Period  Weeks    Status  Achieved      OT LONG TERM GOAL #3   Title  Patient will improve right shoulder strength to 5/5 for improved ability to steer jet skis.    Time  6    Period  Weeks    Status  Achieved      OT LONG TERM GOAL #4   Title  Patient will decrease pain to 2/10 or better in right shoulder when sleeping.    Time  6    Period  Weeks    Status  Achieved      OT LONG TERM GOAL #5   Title  Patient will decrease fascial restrictions to trace for improved pain free mobilty needed for adl completion.     Time  6    Period  Weeks    Status  Achieved            Plan - 08/11/18 1642    Clinical Impression Statement  A: Pt presents for reassessment since clinic closure. Patient is currently 5/5 for shoulder strength and A/ROM and P/ROM is [facility]. All therapy goals have been met. During session, HEP was updated to include shoulder stretches and strengthening using a red theraband. Patient reports that within the past few days, his shoulder has really improved compared to before when he was having a lot of pain with just getting dressed. He only experiences occassional pain in his shoulder when getting his shirt on or off. Discharge with HEP was recommended this session and patient is in agreement.     Body Structure / Function / Physical Skills  ADL;Muscle spasms;ROM;Strength;Scar mobility;Pain;UE functional use;Fascial restriction    Plan  P: D/C with HEP.     Consulted and Agree with Plan of Care  Patient       Patient will benefit from skilled therapeutic intervention in order to improve the following deficits and impairments:  Body  Structure / Function / Physical Skills  Visit Diagnosis: Other symptoms and signs involving the musculoskeletal system  Stiffness of right shoulder, not elsewhere classified  Acute pain of right shoulder    Problem List Patient Active Problem List   Diagnosis Date Noted  . Nontraumatic complete tear of right rotator cuff   . Impingement syndrome of right shoulder   . Chest pain 01/28/2018    OCCUPATIONAL THERAPY DISCHARGE SUMMARY  Visits from Start of Care: 6  Current functional level related to goals / functional outcomes: See above   Remaining deficits: See above   Education / Equipment: See above Plan: Patient agrees to discharge.  Patient goals were met. Patient is being discharged due to meeting the stated rehab goals.  ?????  Ailene Ravel, OTR/L,CBIS  [phone]  08/11/2018, 4:49 PM  [facility] [address] [place], [place], [address] Phone: [phone]   Fax:  [phone]  Name: Kenneth Rowe MRN: [id number] Date of Birth: Mar 29, 1971

## 2019-03-01 ENCOUNTER — Ambulatory Visit (INDEPENDENT_AMBULATORY_CARE_PROVIDER_SITE_OTHER): Payer: 59 | Admitting: Physician Assistant

## 2019-03-01 ENCOUNTER — Encounter: Payer: Self-pay | Admitting: Physician Assistant

## 2019-03-01 ENCOUNTER — Other Ambulatory Visit: Payer: Self-pay

## 2019-03-01 VITALS — Ht 69.0 in | Wt 245.0 lb

## 2019-03-01 DIAGNOSIS — M7541 Impingement syndrome of right shoulder: Secondary | ICD-10-CM | POA: Diagnosis not present

## 2019-03-01 MED ORDER — LIDOCAINE HCL 1 % IJ SOLN
0.5000 mL | INTRAMUSCULAR | Status: AC | PRN
  Administered 2019-03-01: .5 mL

## 2019-03-01 MED ORDER — METHYLPREDNISOLONE ACETATE 40 MG/ML IJ SUSP
40.0000 mg | INTRAMUSCULAR | Status: AC | PRN
  Administered 2019-03-01: 40 mg via INTRA_ARTICULAR

## 2019-03-01 MED ORDER — LIDOCAINE HCL 1 % IJ SOLN
5.0000 mL | INTRAMUSCULAR | Status: AC | PRN
  Administered 2019-03-01: 5 mL

## 2019-03-01 NOTE — Progress Notes (Signed)
Office Visit Note   Patient: Kenneth Rowe           Date of Birth: [birth year]           MRN: [id number] Visit Date: 03/01/2019              Requested by: Caryl Bis, MD [place],  [place] [address] PCP: Caryl Bis, MD  Chief Complaint  Patient presents with  . Right Shoulder - Pain, Follow-up      HPI: This is a pleasant gentleman who is 10 months status post right shoulder arthroscopy debridement.  He had significant tearing of the rotator cuff.  Despite all this he had done very well despite physical therapy being shortened because of Covid.  In October he was trimming some bushes and he developed a recurrence of some shoulder pain in the right shoulder he denies any weakness but it is making it difficult for him to sleep  Assessment & Plan: Visit Diagnoses: No diagnosis found.  Plan: He will follow up in 3 weeks to review the results of the injection  Follow-Up Instructions: No follow-ups on file.   Ortho Exam  Patient is alert, oriented, no adenopathy, well-dressed, normal affect, normal respiratory effort. Right shoulder: Well-healed surgical portals distal CMS is intact he has excellent strength with resisted abduction external rotation he is able to extend his shoulder to almost 180 degrees.  Imaging: No results found. No images are attached to the encounter.  Labs: No results found for: HGBA1C, ESRSEDRATE, CRP, LABURIC, REPTSTATUS, GRAMSTAIN, CULT, LABORGA   No results found for: ALBUMIN, PREALBUMIN, LABURIC  No results found for: MG No results found for: VD25OH  No results found for: PREALBUMIN CBC EXTENDED Latest Ref Rng & Units 05/14/2018 01/26/2018  WBC 4.0 - 10.5 K/uL 7.3 7.2  RBC 4.22 - 5.81 MIL/uL 5.13 5.25  HGB 13.0 - 17.0 g/dL 15.2 15.2  HCT 39.0 - 52.0 % 45.7 44.5  PLT 150 - 400 K/uL 213 243  NEUTROABS 1.4 - 7.0 x10E3/uL - 3.5  LYMPHSABS 0.7 - 3.1 x10E3/uL - 3.0     Body mass index is 36.18 kg/m.  Orders:  No orders  of the defined types were placed in this encounter.  No orders of the defined types were placed in this encounter.    Procedures: Large Joint Inj: R glenohumeral on 03/01/2019 4:25 PM Indications: diagnostic evaluation and pain Details: 22 G 1.5 in needle, posterior approach  Arthrogram: No  Medications: 5 mL lidocaine 1 %; 40 mg methylPREDNISolone acetate 40 MG/ML; 0.5 mL lidocaine 1 % Outcome: tolerated well, no immediate complications Procedure, treatment alternatives, risks and benefits explained, specific risks discussed. Consent was given by the patient. Immediately prior to procedure a time out was called to verify the correct patient, procedure, equipment, support staff and site/side marked as required. Patient was prepped and draped in the usual sterile fashion.      Clinical Data: No additional findings.  ROS:  All other systems negative, except as noted in the HPI. Review of Systems  Objective: Vital Signs: Ht 5' 9"  (1.753 m)   Wt 245 lb (111.1 kg)   BMI 36.18 kg/m   Specialty Comments:  No specialty comments available.  PMFS History: Patient Active Problem List   Diagnosis Date Noted  . Nontraumatic complete tear of right rotator cuff   . Impingement syndrome of right shoulder   . Chest pain 01/28/2018   Past Medical History:  Diagnosis Date  .  GERD (gastroesophageal reflux disease)   . Hyperlipidemia   . Hypertension     Family History  Problem Relation Age of Onset  . Hypertension Mother   . Hypertension Father   . Heart attack Father 58       first MI at 77  . Diabetes Maternal Grandmother   . Heart attack Paternal Grandfather     Past Surgical History:  Procedure Laterality Date  . LEFT HEART CATH AND CORONARY ANGIOGRAPHY N/A 01/28/2018   Procedure: LEFT HEART CATH AND CORONARY ANGIOGRAPHY;  Surgeon: Belva Crome, MD;  Location: [facility];  Service: Cardiovascular;  Laterality: N/A;  . SHOULDER ARTHROSCOPY Right 05/21/2018    Procedure: RIGHT SHOULDER ARTHROSCOPY, DEBRIDEMENT, AND DECOMPRESSION;  Surgeon: Newt Minion, MD;  Location: [facility];  Service: Orthopedics;  Laterality: Right;  . TONSILLECTOMY  1999   Social History   Occupational History    CommentGarment/textile technologist  Tobacco Use  . Smoking status: Never Smoker  . Smokeless tobacco: Never Used  Substance and Sexual Activity  . Alcohol use: Yes    Alcohol/week: 0.0 standard drinks    Comment: Very Rare  . Drug use: Never  . Sexual activity: Not on file

## 2019-03-02 ENCOUNTER — Encounter: Payer: Self-pay | Admitting: Radiology

## 2019-03-15 ENCOUNTER — Ambulatory Visit (INDEPENDENT_AMBULATORY_CARE_PROVIDER_SITE_OTHER): Payer: 59 | Admitting: Physician Assistant

## 2019-03-15 ENCOUNTER — Other Ambulatory Visit: Payer: Self-pay

## 2019-03-15 ENCOUNTER — Encounter: Payer: Self-pay | Admitting: Physician Assistant

## 2019-03-15 VITALS — Ht 69.0 in | Wt 245.0 lb

## 2019-03-15 DIAGNOSIS — M7541 Impingement syndrome of right shoulder: Secondary | ICD-10-CM | POA: Diagnosis not present

## 2019-03-15 NOTE — Progress Notes (Signed)
Office Visit Note   Patient: Kenneth Rowe           Date of Birth: 1970-09-24           MRN: [id number] Visit Date: 03/15/2019              Requested by: Caryl Bis, MD [place],  [place] [address] PCP: Caryl Bis, MD  Chief Complaint  Patient presents with  . Right Shoulder - Follow-up    05/21/2018 right shoulder arthro S/p shoulder injection f/u      HPI: This is a pleasant gentleman who is 10 months status post right shoulder arthroscopy.  He visited with me 2 weeks ago because he was having some increased shoulder pain in the right after doing some work.  I did give him a steroid injection.  He states this is helped significantly he still gets some tightness in the shoulder down the arm but this is relieved with stretching and massage  Assessment & Plan: Visit Diagnoses: No diagnosis found.  Plan: He may follow-up as needed.  He understands he could get 1 of these injections up to 4 times a year.  We also discussed the importance of keeping his shoulder stretched out  Follow-Up Instructions: No follow-ups on file.   Ortho Exam  Patient is alert, oriented, no adenopathy, well-dressed, normal affect, normal respiratory effort. Right shoulder rotator cuff strength is excellent no pain with external or internal rotation distal CMS is intact  Imaging: No results found. No images are attached to the encounter.  Labs: No results found for: HGBA1C, ESRSEDRATE, CRP, LABURIC, REPTSTATUS, GRAMSTAIN, CULT, LABORGA   No results found for: ALBUMIN, PREALBUMIN, LABURIC  No results found for: MG No results found for: VD25OH  No results found for: PREALBUMIN CBC EXTENDED Latest Ref Rng & Units 05/14/2018 01/26/2018  WBC 4.0 - 10.5 K/uL 7.3 7.2  RBC 4.22 - 5.81 MIL/uL 5.13 5.25  HGB 13.0 - 17.0 g/dL 15.2 15.2  HCT 39.0 - 52.0 % 45.7 44.5  PLT 150 - 400 K/uL 213 243  NEUTROABS 1.4 - 7.0 x10E3/uL - 3.5  LYMPHSABS 0.7 - 3.1 x10E3/uL - 3.0     Body mass  index is 36.18 kg/m.  Orders:  No orders of the defined types were placed in this encounter.  No orders of the defined types were placed in this encounter.    Procedures: No procedures performed  Clinical Data: No additional findings.  ROS:  All other systems negative, except as noted in the HPI. Review of Systems  Objective: Vital Signs: Ht 5' 9"  (1.753 m)   Wt 245 lb (111.1 kg)   BMI 36.18 kg/m   Specialty Comments:  No specialty comments available.  PMFS History: Patient Active Problem List   Diagnosis Date Noted  . Nontraumatic complete tear of right rotator cuff   . Impingement syndrome of right shoulder   . Chest pain 01/28/2018   Past Medical History:  Diagnosis Date  . GERD (gastroesophageal reflux disease)   . Hyperlipidemia   . Hypertension     Family History  Problem Relation Age of Onset  . Hypertension Mother   . Hypertension Father   . Heart attack Father 27       first MI at 80  . Diabetes Maternal Grandmother   . Heart attack Paternal Grandfather     Past Surgical History:  Procedure Laterality Date  . LEFT HEART CATH AND CORONARY ANGIOGRAPHY N/A 01/28/2018  Procedure: LEFT HEART CATH AND CORONARY ANGIOGRAPHY;  Surgeon: Belva Crome, MD;  Location: [facility];  Service: Cardiovascular;  Laterality: N/A;  . SHOULDER ARTHROSCOPY Right 05/21/2018   Procedure: RIGHT SHOULDER ARTHROSCOPY, DEBRIDEMENT, AND DECOMPRESSION;  Surgeon: Newt Minion, MD;  Location: [facility];  Service: Orthopedics;  Laterality: Right;  . TONSILLECTOMY  1999   Social History   Occupational History    CommentGarment/textile technologist  Tobacco Use  . Smoking status: Never Smoker  . Smokeless tobacco: Never Used  Substance and Sexual Activity  . Alcohol use: Yes    Alcohol/week: 0.0 standard drinks    Comment: Very Rare  . Drug use: Never  . Sexual activity: Not on file

## 2020-05-14 IMAGING — MR MR SHOULDER*R* W/O CM
4 of 6 series · 23 of 40 positions shown · non-contrast
Comparison: None.

CLINICAL DATA: Initially injured shoulder moving a dresser and
May 2017. Persistent pain.

EXAM:
MRI OF THE RIGHT SHOULDER WITHOUT CONTRAST
TECHNIQUE: Multiplanar, multisequence MR imaging of the shoulder was performed.
No intravenous contrast was administered.

[Series 6: PD fat-sat · axial · right · 4.0mm · 0.44mm/px · z∈[-25,+73]mm · 7 of 24 slices shown (1 of 3)]
[im 1/24]
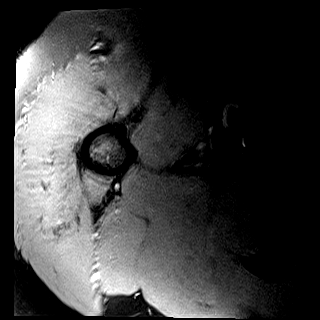
[im 4/24]
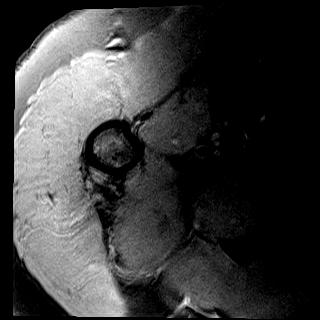
[im 8/24]
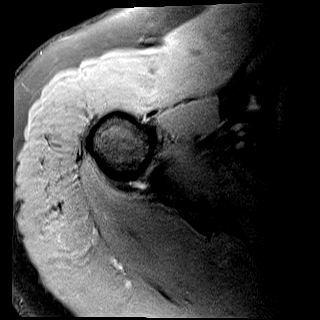
[im 12/24]
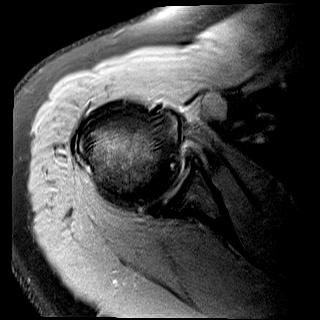
[im 16/24]
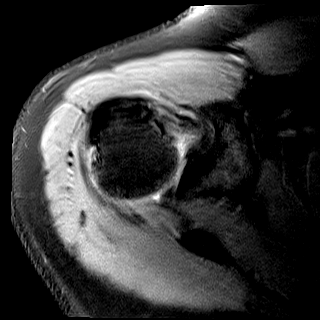
[im 20/24]
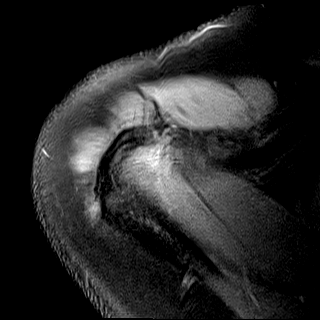
[im 24/24]
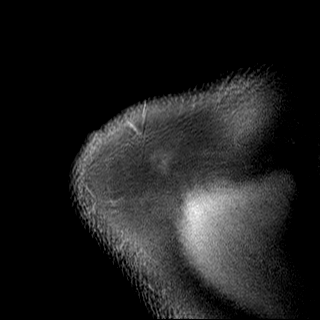

[Series 7: T2 fat-sat · oblique · right · 4.0mm · 0.22mm/px · 3 of 21 slices shown]
[im 5/21]
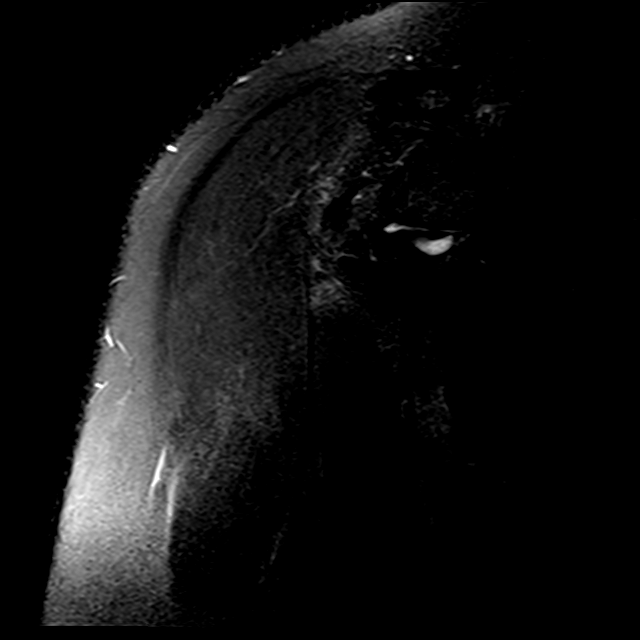
[im 13/21]
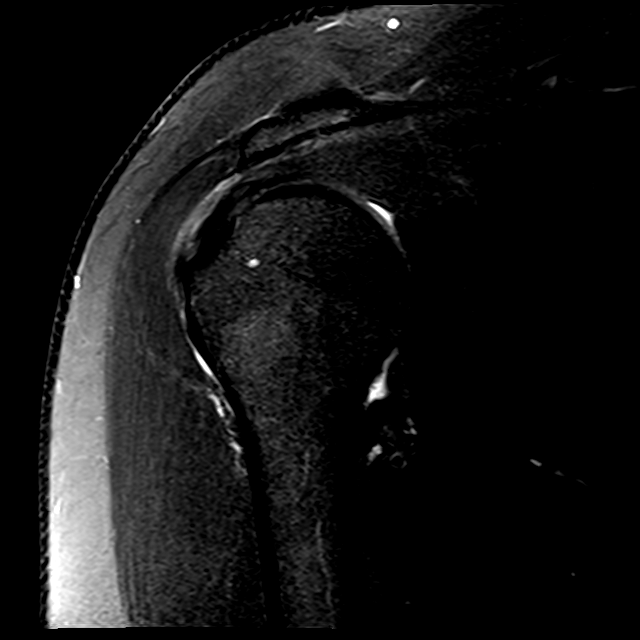
[im 21/21]
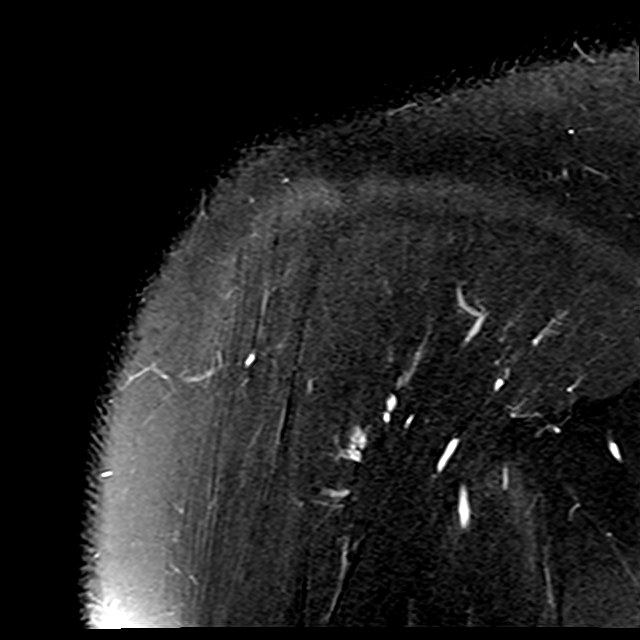

[Series 8: PD fat-sat · oblique · right · 4.0mm · 0.22mm/px · 6 of 21 slices shown (2 of 3)]
[im 1/21]
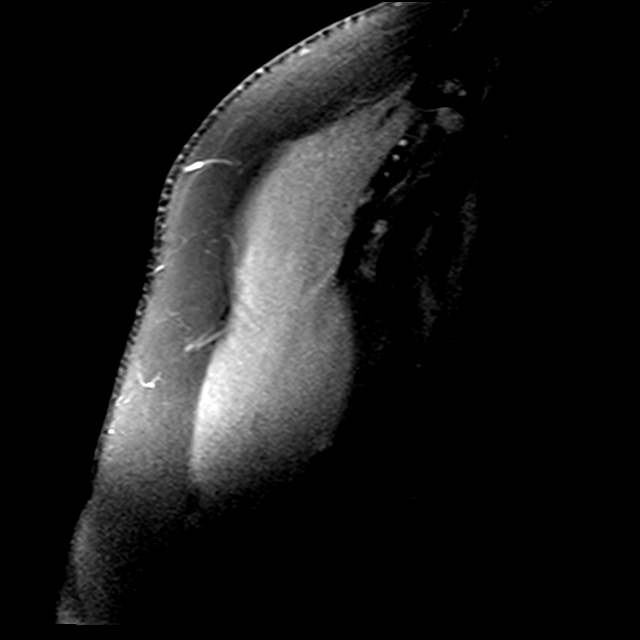
[im 5/21]
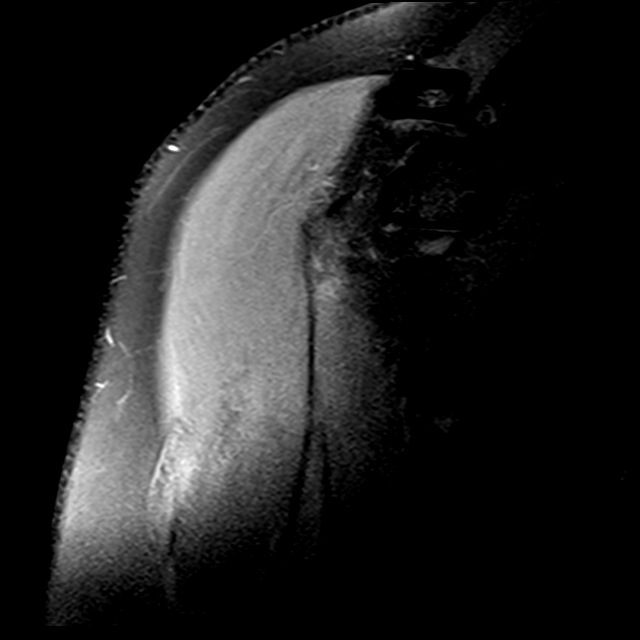
[im 9/21]
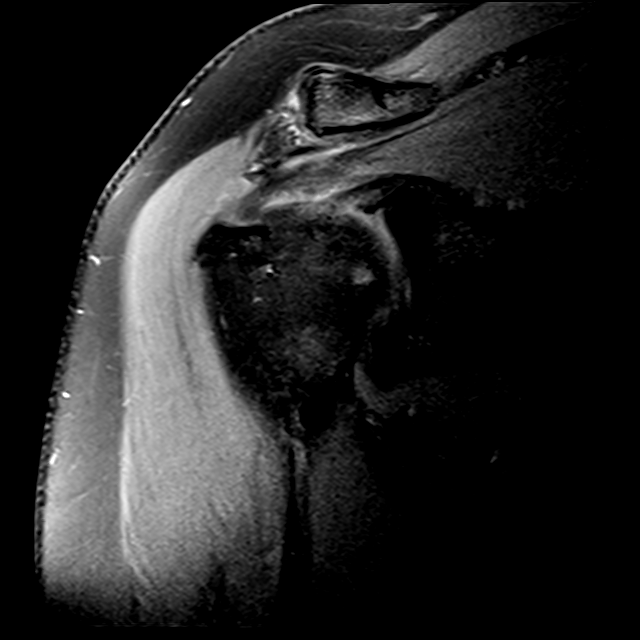
[im 13/21]
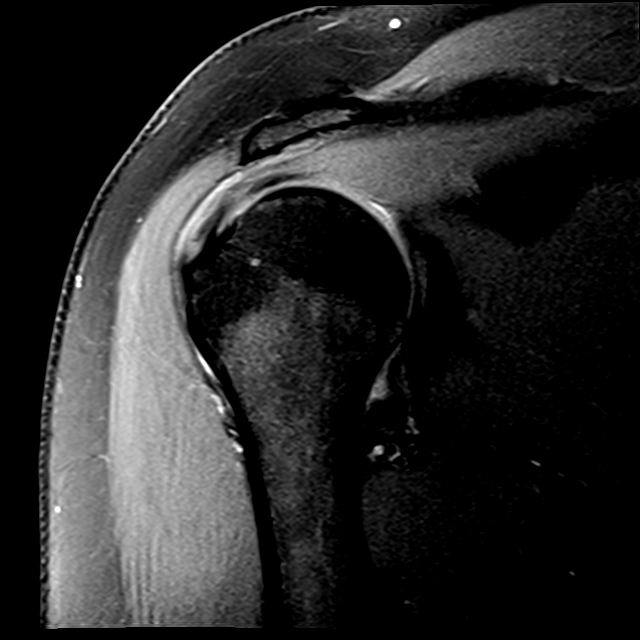
[im 17/21]
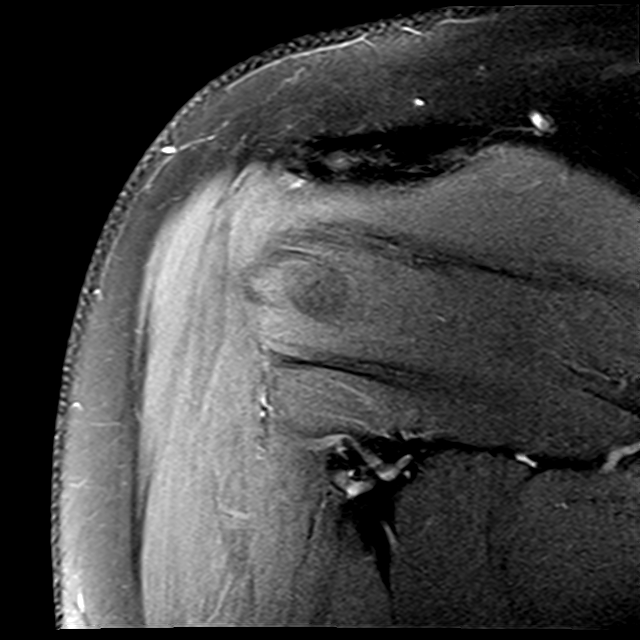
[im 21/21]
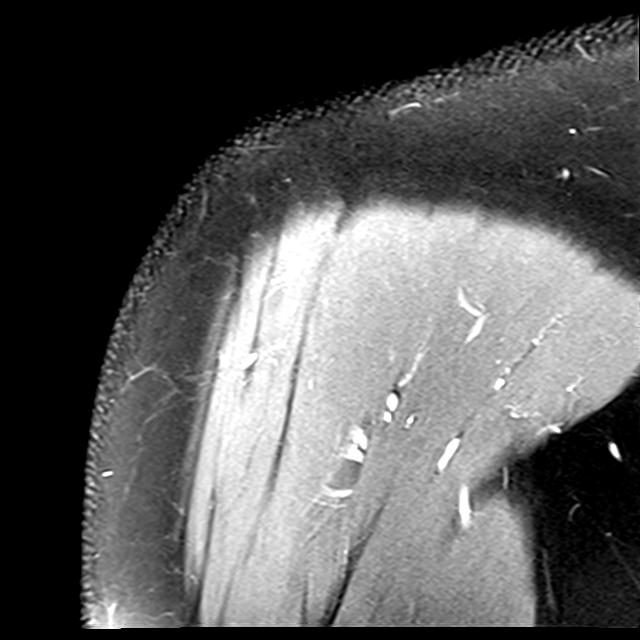

[Series 11: PD fat-sat · axial · right · 4.0mm · 0.44mm/px · z∈[-17,+80]mm · 7 of 24 slices shown (3 of 3)]
[im 1/24]
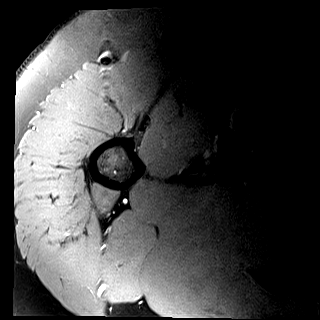
[im 4/24]
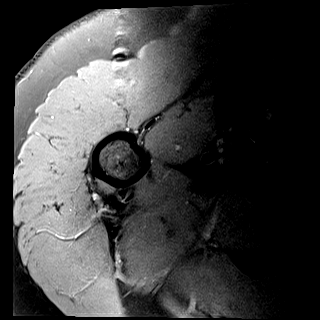
[im 8/24]
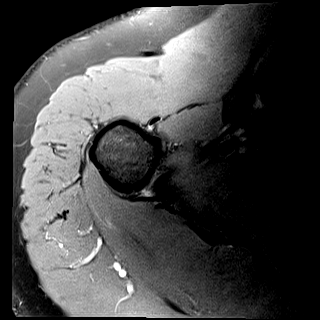
[im 12/24]
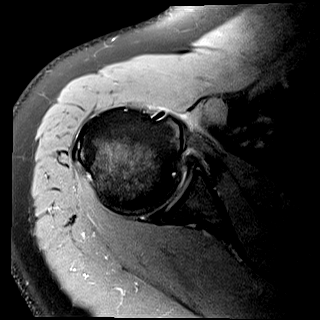
[im 16/24]
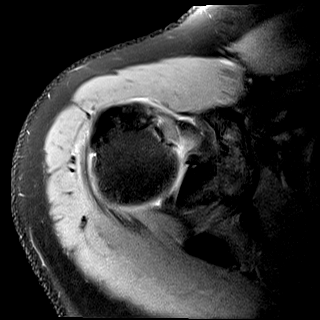
[im 20/24]
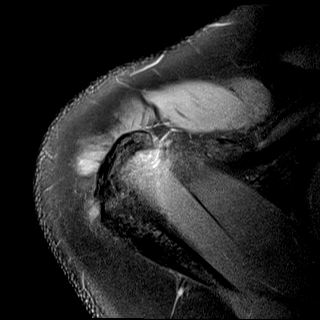
[im 24/24]
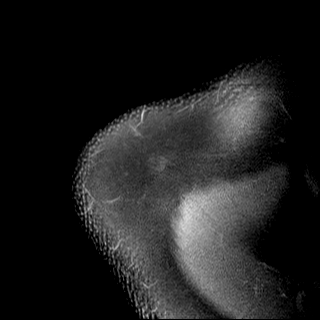

[23 of 40 positions shown; findings below may reference images not displayed]

FINDINGS: Rotator cuff: Moderate rotator cuff tendinopathy/tendinosis with
interstitial tears and areas of bursal and articular surface fraying
and fibrillation. No discrete full-thickness retracted tear.

Muscles:  Normal

Biceps long head:  Intact.

Acromioclavicular Joint: Mild degenerative changes. Type 2 acromion.
Mild lateral downsloping and undersurface spurring.

Glenohumeral Joint: No significant degenerative changes or
synovitis. Small amount of joint fluid but no overt effusion.

Labrum: Anterior superior labral tear and degenerative changes
involving posterior labrum. The superior labrum appears normal.

Bones:  No acute bony findings.

Other: Mild subacromial/subdeltoid bursitis.
IMPRESSION: 1. Moderate rotator cuff tendinopathy/tendinosis with interstitial
tears and areas of bursal and articular surface fraying and
fibrillation. No full thickness retracted tear.
2. Anterior superior labral tear.
3. Intact long head biceps tendon and superior labrum.
4. Mild lateral downsloping and undersurface spurring from a type 2
acromion.
5. Mild subacromial/subdeltoid bursitis.
